# Patient Record
Sex: Female | Born: 1960
Health system: Southern US, Community
[De-identification: ages and names within clinical notes are randomized; demographics above are authoritative.]

## PROBLEM LIST (undated history)

## (undated) DIAGNOSIS — S42209A Unspecified fracture of upper end of unspecified humerus, initial encounter for closed fracture: Secondary | ICD-10-CM

## (undated) DIAGNOSIS — N6320 Unspecified lump in the left breast, unspecified quadrant: Secondary | ICD-10-CM

## (undated) DIAGNOSIS — C44721 Squamous cell carcinoma of skin of unspecified lower limb, including hip: Secondary | ICD-10-CM

## (undated) DIAGNOSIS — K219 Gastro-esophageal reflux disease without esophagitis: Secondary | ICD-10-CM

## (undated) DIAGNOSIS — N83209 Unspecified ovarian cyst, unspecified side: Secondary | ICD-10-CM

## (undated) HISTORY — PX: OVARIAN CYST REMOVAL: SHX89

## (undated) HISTORY — DX: Unspecified ovarian cyst, unspecified side: N83.209

## (undated) HISTORY — PX: HUMERUS FRACTURE SURGERY: SHX670

## (undated) HISTORY — DX: Unspecified fracture of upper end of unspecified humerus, initial encounter for closed fracture: S42.209A

---
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
## 1980-01-20 DIAGNOSIS — N83209 Unspecified ovarian cyst, unspecified side: Secondary | ICD-10-CM

## 1980-01-20 HISTORY — DX: Unspecified ovarian cyst, unspecified side: N83.209

## 1997-11-07 ENCOUNTER — Other Ambulatory Visit: Admission: RE | Admit: 1997-11-07 | Discharge: 1997-11-07 | Payer: Self-pay | Admitting: Obstetrics and Gynecology

## 1998-11-18 ENCOUNTER — Other Ambulatory Visit: Admission: RE | Admit: 1998-11-18 | Discharge: 1998-11-18 | Payer: Self-pay | Admitting: Obstetrics and Gynecology

## 2000-02-20 ENCOUNTER — Other Ambulatory Visit: Admission: RE | Admit: 2000-02-20 | Discharge: 2000-02-20 | Payer: Self-pay | Admitting: Obstetrics and Gynecology

## 2001-04-18 ENCOUNTER — Other Ambulatory Visit: Admission: RE | Admit: 2001-04-18 | Discharge: 2001-04-18 | Payer: Self-pay | Admitting: Obstetrics and Gynecology

## 2002-05-17 ENCOUNTER — Other Ambulatory Visit: Admission: RE | Admit: 2002-05-17 | Discharge: 2002-05-17 | Payer: Self-pay | Admitting: Obstetrics and Gynecology

## 2003-10-11 ENCOUNTER — Other Ambulatory Visit: Admission: RE | Admit: 2003-10-11 | Discharge: 2003-10-11 | Payer: Self-pay | Admitting: Obstetrics and Gynecology

## 2004-04-25 ENCOUNTER — Ambulatory Visit: Payer: Self-pay | Admitting: Internal Medicine

## 2004-07-28 ENCOUNTER — Encounter: Admission: RE | Admit: 2004-07-28 | Discharge: 2004-07-28 | Payer: Self-pay | Admitting: Gastroenterology

## 2004-10-08 ENCOUNTER — Ambulatory Visit: Payer: Self-pay | Admitting: Internal Medicine

## 2004-11-11 ENCOUNTER — Other Ambulatory Visit: Admission: RE | Admit: 2004-11-11 | Discharge: 2004-11-11 | Payer: Self-pay | Admitting: Obstetrics and Gynecology

## 2005-01-30 ENCOUNTER — Ambulatory Visit: Payer: Self-pay | Admitting: Internal Medicine

## 2005-05-04 ENCOUNTER — Ambulatory Visit: Payer: Self-pay | Admitting: Internal Medicine

## 2009-04-30 ENCOUNTER — Encounter: Payer: Self-pay | Admitting: Internal Medicine

## 2009-05-22 ENCOUNTER — Encounter: Payer: Self-pay | Admitting: Internal Medicine

## 2009-06-03 ENCOUNTER — Ambulatory Visit: Payer: Self-pay | Admitting: Internal Medicine

## 2009-06-03 DIAGNOSIS — E559 Vitamin D deficiency, unspecified: Secondary | ICD-10-CM | POA: Insufficient documentation

## 2009-06-03 DIAGNOSIS — E785 Hyperlipidemia, unspecified: Secondary | ICD-10-CM | POA: Insufficient documentation

## 2009-06-03 LAB — CONVERTED CEMR LAB
Cholesterol, target level: 200 mg/dL
HDL goal, serum: 40 mg/dL
LDL Goal: 160 mg/dL

## 2010-01-19 HISTORY — PX: COLONOSCOPY: SHX174

## 2010-02-09 ENCOUNTER — Encounter: Payer: Self-pay | Admitting: Obstetrics and Gynecology

## 2010-02-18 NOTE — Letter (Signed)
Summary: [facility]   Imported By: Lanelle Bal 06/06/2009 14:15:00  _____________________________________________________________________  External Attachment:    Type:   Image     Comment:   External Document

## 2010-02-18 NOTE — Assessment & Plan Note (Signed)
Summary: ELEVATED LIPIDS,[employer],SENT BY DR. MCCOMB/RH......   Vital Signs:  Patient profile:   50 year old female Weight:      130.8 pounds Pulse rate:   72 / minute Resp:     12 per minute BP sitting:   120 / 68  (left arm) Cuff size:   regular  Vitals Entered By: Shonna Chock (Jun 03, 2009 10:54 AM) CC: Discuss Labs from anothe Doctor, Lipid Management Comments REVIEWED MED LIST, PATIENT AGREED DOSE AND INSTRUCTION CORRECT    CC:  Discuss Labs from anothe Doctor and Lipid Management.  History of Present Illness: Labs from Dr Arelia Sneddon  reviewed  & risks discussed. Vitamin D was 32 on no vitamin D supplementation. LDL 117, HDL 45, TG 216; no specific diet. TG have been 333.  Lipid Management History:      Negative NCEP/ATP III risk factors include female age less than 50 years old, no history of early menopause without estrogen hormone replacement, non-diabetic, no family history for ischemic heart disease, non-tobacco-user status, non-hypertensive, no ASHD (atherosclerotic heart disease), no prior stroke/TIA, no peripheral vascular disease, and no history of aortic aneurysm.     Preventive Screening-Counseling & Management  Alcohol-Tobacco     Smoking Status: never  Caffeine-Diet-Exercise     Does Patient Exercise: yes  Allergies (verified): No Known Drug Allergies  Past History:  Past Medical History: Vitamin D deficiency Hyperlipidemia ( chiefly Hypertriglyceridemia by history ). [place] LDL goal = < 160.  Family History: Father: Osteoporosis, lipids Mother: Osteoporosis, lipids Siblings: bro lipids; PGF MI; PGM MI  Social History: Never Smoked Alcohol use-yes:minimal Regular exercise-yes: running up to 20 mpweek , Elliptical  w/o symptoms (training for 1/2 Marathon) Smoking Status:  never Does Patient Exercise:  yes  Review of Systems CV:  Denies chest pain or discomfort, leg cramps with exertion, palpitations, shortness of breath with exertion, swelling  of feet, and swelling of hands.  Physical Exam  General:  well-nourished; alert,appropriate and cooperative throughout examination Lungs:  Normal respiratory effort, chest expands symmetrically. Lungs are clear to auscultation, no crackles or wheezes. Heart:  Normal rate and regular rhythm. S1 and S2 normal without gallop, murmur, click, rub. Pulses:  R and L carotid,radial,dorsalis pedis and posterior tibial pulses are full and equal bilaterally Psych:  memory intact for recent and remote, normally interactive, and good eye contact.   Focused & intelligent   Impression & Recommendations:  Problem # 1:  HYPERLIPIDEMIA (ICD-272.4)  Problem # 2:  VITAMIN D DEFICIENCY (ICD-268.9)  Lipid Assessment/Plan:      Based on NCEP/ATP III, the patient's risk factor category is "0-1 risk factors".  The patient's lipid goals are as follows: Total cholesterol goal is 200; LDL cholesterol goal is 160; HDL cholesterol goal is 40; Triglyceride goal is 150.  Her LDL cholesterol goal has been met.    Patient Instructions: 1)  Consume LESS THAN 25 grams of sugar / day from foods & drinks with High Fructose  Corn Syrup as #1,2 or # 3 on label.Please schedule a follow-up  fastin Lab appointment in 4 months for the NMR Lipoprofile Lipid Panel ,glucose, & vit D level. Vit D3 @ least 1000 International Units once daily

## 2010-09-04 ENCOUNTER — Ambulatory Visit (INDEPENDENT_AMBULATORY_CARE_PROVIDER_SITE_OTHER): Payer: BC Managed Care – PPO | Admitting: Sports Medicine

## 2010-09-04 ENCOUNTER — Encounter: Payer: Self-pay | Admitting: Sports Medicine

## 2010-09-04 VITALS — BP 112/73 | HR 66 | Temp 97.7°F | Ht 64.0 in | Wt 130.0 lb

## 2010-09-04 DIAGNOSIS — M25561 Pain in right knee: Secondary | ICD-10-CM

## 2010-09-04 DIAGNOSIS — M25569 Pain in unspecified knee: Secondary | ICD-10-CM

## 2010-09-04 HISTORY — DX: Pain in right knee: M25.561

## 2010-09-04 MED ORDER — NITROGLYCERIN 0.2 MG/HR TD PT24: 1.0000 | MEDICATED_PATCH | Freq: Every day | TRANSDERMAL | Status: DC

## 2010-09-04 NOTE — Patient Instructions (Signed)
Very nice to meet you We will try a nitro patch.  Put 1/4 of a patch on daily Consider more non weight bearing exercises such as biking or swimming for some time but if you want to run likely won't cause much more problem but only do it every other day.  We will put a compression sleeve on as well.  Also do the exercises that we gave you.  Ball between your knees and extend your knee up to 45-60 degrees and then extension, potentially with ankle weights Straight leg raise raises On a book do calf raises/ squats on a book to help with extension We want you to come back in 6 weeks to make sure you are getting better.

## 2010-09-04 NOTE — Progress Notes (Signed)
  Subjective:    Patient ID: Jamie Avila, female    DOB: [birth year], 50 y.o.   MRN: [id number]  HPI Pt states in the first week of April started to have pain in knee after she was doing some leg extension exercises on a machine at the gym.  Pt states after that started to have severe pain, right knee on the medial aspect with squatting or even picking something up.  Pt attempted to take it easy for a couple of weeks and then started to run again in May and started to have pain again right above the knee cap on the medial side. Pt then took another two weeks off from running but then in June attempted to run again.  In patient states this time with the knee she had a little bit of swelling and noticed that she cannot sit [place] style anymore. She does feel some catching from time to time it feels like popping underneath her kneecap. Patient's main question today is when can she start running again.    Review of Systems Review of systems is otherwise negative unless noted in the history of present illness HPI Patient's past medical history past surgical history medications and allergies have all been reviewed.    Objective:   Physical Exam Gen: Knee: Normal to inspection with no erythema or effusion or obvious bony abnormalities. Palpation normal with no warmth or joint line tenderness  or condyle tenderness.  Pt does state some tenderness at the superior medial pole of the patella.  Pt though does have significant pooping of the knee when going from a flexed position to an extended position.  ROM normal in flexion and extension and lower leg rotation. Ligaments with solid consistent endpoints including ACL, PCL, LCL, MCL. Negative Mcmurray's and provocative meniscal tests. Non painful patellar compression but does have some crepitus.  Patellar and quadriceps tendons unremarkable. Hamstring and quadriceps strength is normal.  MSK ultrasound Pt MCL intact without any effusion or tear  seen. Pt posterior groove of the patella is unremarkable with good cartilage.  Pt the superior pole of medial patella has some spurring and at the insertion of the VMO does show some small calcifications and mild edema suggested by hypoechoic change There is some increase in doppler activity and neovessels        Assessment & Plan:

## 2010-09-04 NOTE — Assessment & Plan Note (Addendum)
Discussed findings with patient. The possible injury was likely due to the quadricep extensions patient did at the gym. Gave pt rehab exercises and stretches to do. Patient was prescribed on nitroglycerin patch and will be doing a quarter of a patch daily. Patient is able to do any exercise that is tolerable. Patient to return to clinic in 6 weeks  This appears to involve only about 20% of the tendon from the vastus medialis obliqus

## 2010-10-16 ENCOUNTER — Ambulatory Visit (INDEPENDENT_AMBULATORY_CARE_PROVIDER_SITE_OTHER): Payer: BC Managed Care – PPO | Admitting: Sports Medicine

## 2010-10-16 VITALS — BP 106/70

## 2010-10-16 DIAGNOSIS — S43006A Unspecified dislocation of unspecified shoulder joint, initial encounter: Secondary | ICD-10-CM

## 2010-10-16 DIAGNOSIS — M25569 Pain in unspecified knee: Secondary | ICD-10-CM

## 2010-10-16 DIAGNOSIS — M25561 Pain in right knee: Secondary | ICD-10-CM

## 2010-10-16 HISTORY — DX: Unspecified dislocation of unspecified shoulder joint, initial encounter: S43.006A

## 2010-10-16 NOTE — Assessment & Plan Note (Signed)
Much improved She can wean off the nitroglycerin patches over the next couple weeks Keep up the exercises at least 3 times per week and gradually restart her running

## 2010-10-16 NOTE — Patient Instructions (Signed)
Ok to stop nitroglycerin on knee, if knee pain returns you may put nitroglycerin patch on   Continue doing knee exercises 3-4 times per week  Ok to start running  Please follow up as needed

## 2010-10-16 NOTE — Progress Notes (Signed)
  Subjective:    Patient ID: Jamie Avila, female    DOB: 08/02/1960, 50 y.o.   MRN: [id number]  HPI 3 and 1/2 weeks ago had post dislocation of RT shoulder Had AC separation on left Fell into a river while hiking  Now getting motion back in RT shoulder Seeing Dr Orlan Leavens at [facility]  Rt knee less painful But has not been using this as much No problems with the nitroglycerin patches She has been doing her rehabilitation exercises   Review of Systems     Objective:   Physical Exam NAD  Knee: Normal to inspection with no erythema or effusion or obvious bony abnormalities. Palpation normal with no warmth or joint line tenderness or patellar tenderness or condyle tenderness. ROM normal in flexion and extension and lower leg rotation. Ligaments with solid consistent endpoints including ACL, PCL, LCL, MCL. Negative Mcmurray's and provocative meniscal tests. Non painful patellar compression. Much less clicking and popping at upper outer patella  Patellar and quadriceps tendons unremarkable. Hamstring and quadriceps strength is normal.  RT shoulder full forward flexion/ abduction to 90 deg/ ER to neutral  Slt step off left [place]  MSK ultrasound On the right knee the quadriceps tendon looks intact  There is less calcification than noted before On lateral superior border there is still a little bit of scar tissue just below the tendon Doppler activity over the tendon has now returned to normal  Right rotator cuff is scanned Supraspinatus infraspinatus and subscapularis tendons low normal Bicipital tendon has a very small area of increased fluid but is otherwise normal A.c. joint is normal  Left shoulder is scanned over the a.c. Joint This shows that the a.c. joint is subluxed but not completely separated There is increased fluid in the a.c. joint when compared to the right    Assessment & Plan:

## 2010-10-16 NOTE — Assessment & Plan Note (Signed)
Continue followup with the orthopedic surgeon Once he thinks he is stable he we'll probably send her to physical therapy  She does have a small separation of bone at the insertion of the posterior labrum of the right glenohumeral joint This seems small I don't see any tearing of the rotator cuff I reassured her that I think conservative care will be enough to heal this but she will have to wait to see how it progresses with therapy

## 2011-05-26 ENCOUNTER — Ambulatory Visit (INDEPENDENT_AMBULATORY_CARE_PROVIDER_SITE_OTHER): Payer: BC Managed Care – PPO | Admitting: Sports Medicine

## 2011-05-26 VITALS — BP 126/84

## 2011-05-26 DIAGNOSIS — M7672 Peroneal tendinitis, left leg: Secondary | ICD-10-CM | POA: Insufficient documentation

## 2011-05-26 DIAGNOSIS — M7750 Other enthesopathy of unspecified foot: Secondary | ICD-10-CM

## 2011-05-26 HISTORY — DX: Peroneal tendinitis, left leg: M76.72

## 2011-05-26 MED ORDER — KETOPROFEN POWD: Status: DC

## 2011-05-26 NOTE — Assessment & Plan Note (Signed)
Peroneal tendinitis. Plan to treat for 2 weeks with ketoprofen gel, ankle compression sleeve. Gave exercises including theraband eversion exercises and hip strengthening exercises. Plan to see back in 2 weeks. If not improved may want to image with ultrasound.

## 2011-05-26 NOTE — Progress Notes (Signed)
  Subjective:    Patient ID: Jamie Avila, female    DOB: October 30, 1960, 51 y.o.   MRN: [id number]  HPI Patient presents after running a half marathon on Saturday. She said that mouth and she noticed some left-sided foot pain that was dull. She kept running and in fact sped up. After the race she notes that she was limping quite a bit and in a lot of pain in that area. She iced it that day and took Motrin for 2 days. She felt better yesterday but was very active cleaning out her sons dorm room. This morning she has pain in that left side. She's also noticed a little bit of swelling.   Review of Systems     Objective:   Physical Exam Vital signs reviewed General appearance - alert, well appearing, and in no distress Foot exam-flat feet however no pronation. Posterior tibialis fires normally Running and walking gait are normal. Tender to palpation over the cuboid and the peroneus longus groove. Leg exam-Leg length is symmetric Normal alignment of the legs and knees Strong adduction of hips Weak abduction of hips       Assessment & Plan:

## 2011-05-28 ENCOUNTER — Ambulatory Visit: Payer: BC Managed Care – PPO | Admitting: Sports Medicine

## 2011-06-23 ENCOUNTER — Ambulatory Visit: Payer: BC Managed Care – PPO | Admitting: Sports Medicine

## 2011-09-22 ENCOUNTER — Ambulatory Visit (INDEPENDENT_AMBULATORY_CARE_PROVIDER_SITE_OTHER): Payer: BC Managed Care – PPO | Admitting: Sports Medicine

## 2011-09-22 VITALS — BP 110/70 | Ht 64.0 in | Wt 125.0 lb

## 2011-09-22 DIAGNOSIS — M533 Sacrococcygeal disorders, not elsewhere classified: Secondary | ICD-10-CM

## 2011-09-22 HISTORY — DX: Sacrococcygeal disorders, not elsewhere classified: M53.3

## 2011-09-22 NOTE — Assessment & Plan Note (Signed)
We will use HEP  Preventive activity and daily walking  Reck in 6 wks if not resolved

## 2011-09-22 NOTE — Progress Notes (Signed)
  Subjective:    Patient ID: Jamie Avila, female    DOB: 12-Jul-1960, 51 y.o.   MRN: [id number]  HPI  Patient presents with complaint of LBP x 8 weeks. States that pain initially started when she was walking up steep hills in [place].  She has no h/o back pain. Patient had pain after that first injury in [place] that worsened 3 days later to the point that she could not move. Patient notes that her pain has since improved. She went to a chiropractor in [place] and noted that she had significant improvement after her 1st two sessions. Patient notes that she continues to have discomfort with prolonged sitting. Pain is better with walking. Lifting and pulling exacerbate her pain. She has no pain while lying or sleeping, but does feel stiff in the morning.  Review of Systems     Objective:   Physical Exam  Full active ROM in back flexion, extension, lateral bending, and rotation without pain. No bony vertebral TTP No paraspinal TTP TTP over left SI joint with palpable and tender SI joint mice SLR negative Faber's negative Able to walk on tip toes, heels, lateral feet. Normal cross over gait Good SI joint mobility      Assessment & Plan:  #1. SI joint pain, likely due to healing lumbosacral fascia injury as evidence by tender SI joint and palpable SI joint mice  - Reassured patient of etiology and progress toward recovery  - May return to running but avoid running downhill as this will cause back extension  - Home exercises: Legs to chest, leg to opposite shoulder, leg to chest with side to side roll  - Avoid prolonged sitting

## 2012-03-14 ENCOUNTER — Ambulatory Visit: Payer: BC Managed Care – PPO | Admitting: Internal Medicine

## 2012-03-14 DIAGNOSIS — Z0289 Encounter for other administrative examinations: Secondary | ICD-10-CM

## 2012-03-21 ENCOUNTER — Encounter: Payer: Self-pay | Admitting: Internal Medicine

## 2012-10-20 ENCOUNTER — Ambulatory Visit: Payer: BC Managed Care – PPO | Admitting: Sports Medicine

## 2014-01-19 DIAGNOSIS — S42209A Unspecified fracture of upper end of unspecified humerus, initial encounter for closed fracture: Secondary | ICD-10-CM

## 2014-01-19 HISTORY — DX: Unspecified fracture of upper end of unspecified humerus, initial encounter for closed fracture: S42.209A

## 2014-01-19 HISTORY — PX: HUMERUS FRACTURE SURGERY: SHX670

## 2016-01-30 LAB — LIPID PANEL
CHOLESTEROL: 197 (ref 0–200)
HDL: 46 (ref 35–70)
LDL CALC: 83
TRIGLYCERIDES: 339 — AB (ref 40–160)

## 2016-01-30 LAB — BASIC METABOLIC PANEL: GLUCOSE: 80

## 2016-01-30 LAB — HM DEXA SCAN

## 2017-02-01 DIAGNOSIS — Z1231 Encounter for screening mammogram for malignant neoplasm of breast: Secondary | ICD-10-CM | POA: Diagnosis not present

## 2017-02-01 DIAGNOSIS — Z6824 Body mass index (BMI) 24.0-24.9, adult: Secondary | ICD-10-CM | POA: Diagnosis not present

## 2017-02-01 DIAGNOSIS — Z01419 Encounter for gynecological examination (general) (routine) without abnormal findings: Secondary | ICD-10-CM | POA: Diagnosis not present

## 2017-02-04 ENCOUNTER — Other Ambulatory Visit: Payer: Self-pay | Admitting: Obstetrics and Gynecology

## 2017-02-04 DIAGNOSIS — R928 Other abnormal and inconclusive findings on diagnostic imaging of breast: Secondary | ICD-10-CM

## 2017-02-09 ENCOUNTER — Ambulatory Visit
Admission: RE | Admit: 2017-02-09 | Discharge: 2017-02-09 | Disposition: A | Discharge status: Home / Self Care | Payer: BLUE CROSS/BLUE SHIELD | Source: Ambulatory Visit | Attending: Obstetrics and Gynecology | Admitting: Obstetrics and Gynecology

## 2017-02-09 DIAGNOSIS — R928 Other abnormal and inconclusive findings on diagnostic imaging of breast: Secondary | ICD-10-CM

## 2017-02-09 DIAGNOSIS — N6001 Solitary cyst of right breast: Secondary | ICD-10-CM | POA: Diagnosis not present

## 2017-10-28 ENCOUNTER — Ambulatory Visit: Payer: BLUE CROSS/BLUE SHIELD | Admitting: Family Medicine

## 2017-11-08 ENCOUNTER — Ambulatory Visit: Payer: BLUE CROSS/BLUE SHIELD | Admitting: Family Medicine

## 2017-11-08 ENCOUNTER — Encounter: Payer: Self-pay | Admitting: Family Medicine

## 2017-11-08 VITALS — BP 122/60 | HR 69 | Temp 98.0°F | Ht 64.0 in | Wt 143.2 lb

## 2017-11-08 DIAGNOSIS — E559 Vitamin D deficiency, unspecified: Secondary | ICD-10-CM

## 2017-11-08 DIAGNOSIS — Z23 Encounter for immunization: Secondary | ICD-10-CM | POA: Diagnosis not present

## 2017-11-08 DIAGNOSIS — D229 Melanocytic nevi, unspecified: Secondary | ICD-10-CM

## 2017-11-08 DIAGNOSIS — R5383 Other fatigue: Secondary | ICD-10-CM

## 2017-11-08 DIAGNOSIS — E785 Hyperlipidemia, unspecified: Secondary | ICD-10-CM | POA: Diagnosis not present

## 2017-11-08 DIAGNOSIS — M810 Age-related osteoporosis without current pathological fracture: Secondary | ICD-10-CM

## 2017-11-08 NOTE — Patient Instructions (Addendum)
You can either schedule your bloodwork in near future or wait 3 months to complete.   I have added some dietary information regarding low cholesterol diets that you can review. I would recommend fasting for your bloodwork.   I will call you once I get your results and when I get your previous records. Keep up your healthy lifestyle in the meanwhile!   Cholesterol Cholesterol is a white, waxy, fat-like substance that is needed by the human body in small amounts. The liver makes all the cholesterol we need. Cholesterol is carried from the liver by the blood through the blood vessels. Deposits of cholesterol (plaques) may build up on blood vessel (artery) walls. Plaques make the arteries narrower and stiffer. Cholesterol plaques increase the risk for heart attack and stroke. You cannot feel your cholesterol level even if it is very high. The only way to know that it is high is to have a blood test. Once you know your cholesterol levels, you should keep a record of the test results. Work with your health care provider to keep your levels in the desired range. What do the results mean?  Total cholesterol is a rough measure of all the cholesterol in your blood.  LDL (low-density lipoprotein) is the "bad" cholesterol. This is the type that causes plaque to build up on the artery walls. You want this level to be low.  HDL (high-density lipoprotein) is the "good" cholesterol because it cleans the arteries and carries the LDL away. You want this level to be high.  Triglycerides are fat that the body can either burn for energy or store. High levels are closely linked to heart disease. What are the desired levels of cholesterol?  Total cholesterol below 200.  LDL below 100 for people who are at risk, below 70 for people at very high risk.  HDL above 40 is good. A level of 60 or higher is considered to be protective against heart disease.  Triglycerides below 150. How can I lower my  cholesterol? Diet Follow your diet program as told by your health care provider.  Choose fish or white meat chicken and [place], roasted or baked. Limit fatty cuts of red meat, fried foods, and processed meats, such as sausage and lunch meats.  Eat lots of fresh fruits and vegetables.  Choose whole grains, beans, pasta, potatoes, and cereals.  Choose olive oil, corn oil, or canola oil, and use only small amounts.  Avoid butter, mayonnaise, shortening, or palm kernel oils.  Avoid foods with trans fats.  Drink skim or nonfat milk and eat low-fat or nonfat yogurt and cheeses. Avoid whole milk, cream, ice cream, egg yolks, and full-fat cheeses.  Healthier desserts include angel food cake, ginger snaps, animal crackers, hard candy, popsicles, and low-fat or nonfat frozen yogurt. Avoid pastries, cakes, pies, and cookies.  Exercise  Follow your exercise program as told by your health care provider. A regular program: ? Helps to decrease LDL and raise HDL. ? Helps with weight control.  Do things that increase your activity level, such as gardening, walking, and taking the stairs.  Ask your health care provider about ways that you can be more active in your daily life.  Medicine  Take over-the-counter and prescription medicines only as told by your health care provider. ? Medicine may be prescribed by your health care provider to help lower cholesterol and decrease the risk for heart disease. This is usually done if diet and exercise have failed to bring down cholesterol levels. ?  If you have several risk factors, you may need medicine even if your levels are normal.  This information is not intended to replace advice given to you by your health care provider. Make sure you discuss any questions you have with your health care provider. Document Released: 09/30/2000 Document Revised: 08/03/2015 Document Reviewed: 07/06/2015 Elsevier Interactive Patient Education  2018 [place] refers to food and lifestyle choices that are based on the traditions of countries located on the [employer]. This way of eating has been shown to help prevent certain conditions and improve outcomes for people who have chronic diseases, like kidney disease and heart disease. What are tips for following this plan? Lifestyle  Cook and eat meals together with your family, when possible.  Drink enough fluid to keep your urine clear or pale yellow.  Be physically active every day. This includes: ? Aerobic exercise like running or swimming. ? Leisure activities like gardening, walking, or housework.  Get 7-8 hours of sleep each night.  If recommended by your health care provider, drink red wine in moderation. This means 1 glass a day for nonpregnant women and 2 glasses a day for men. A glass of wine equals 5 oz (150 mL). Reading food labels  Check the serving size of packaged foods. For foods such as rice and pasta, the serving size refers to the amount of cooked product, not dry.  Check the total fat in packaged foods. Avoid foods that have saturated fat or trans fats.  Check the ingredients list for added sugars, such as corn syrup. Shopping  At the grocery store, buy most of your food from the areas near the walls of the store. This includes: ? Fresh fruits and vegetables (produce). ? Grains, beans, nuts, and seeds. Some of these may be available in unpackaged forms or large amounts (in bulk). ? Fresh seafood. ? Poultry and eggs. ? Low-fat dairy products.  Buy whole ingredients instead of prepackaged foods.  Buy fresh fruits and vegetables in-season from local farmers markets.  Buy frozen fruits and vegetables in resealable bags.  If you do not have access to quality fresh seafood, buy precooked frozen shrimp or canned fish, such as tuna, salmon, or sardines.  Buy small amounts of raw or cooked vegetables, salads, or olives  from the deli or salad bar at your store.  Stock your pantry so you always have certain foods on hand, such as olive oil, canned tuna, canned tomatoes, rice, pasta, and beans. Cooking  Cook foods with extra-virgin olive oil instead of using butter or other vegetable oils.  Have meat as a side dish, and have vegetables or grains as your main dish. This means having meat in small portions or adding small amounts of meat to foods like pasta or stew.  Use beans or vegetables instead of meat in common dishes like chili or lasagna.  Experiment with different cooking methods. Try roasting or broiling vegetables instead of steaming or sauteing them.  Add frozen vegetables to soups, stews, pasta, or rice.  Add nuts or seeds for added healthy fat at each meal. You can add these to yogurt, salads, or vegetable dishes.  Marinate fish or vegetables using olive oil, lemon juice, garlic, and fresh herbs. Meal planning  Plan to eat 1 vegetarian meal one day each week. Try to work up to 2 vegetarian meals, if possible.  Eat seafood 2 or more times a week.  Have healthy snacks readily available, such  as: ? Vegetable sticks with hummus. ? [place] yogurt. ? Fruit and nut trail mix.  Eat balanced meals throughout the week. This includes: ? Fruit: 2-3 servings a day ? Vegetables: 4-5 servings a day ? Low-fat dairy: 2 servings a day ? Fish, poultry, or lean meat: 1 serving a day ? Beans and legumes: 2 or more servings a week ? Nuts and seeds: 1-2 servings a day ? Whole grains: 6-8 servings a day ? Extra-virgin olive oil: 3-4 servings a day  Limit red meat and sweets to only a few servings a month What are my food choices?  Mediterranean diet ? Recommended ? Grains: Whole-grain pasta. Brown rice. [place] wheat. Polenta. Couscous. Whole-wheat bread. [place]. ? Vegetables: Artichokes. Beets. Broccoli. Cabbage. Carrots. Eggplant. Green beans. Chard. Kale. Spinach. Onions. Leeks. Peas. Squash.  Tomatoes. Peppers. Radishes. ? Fruits: Apples. Apricots. Avocado. Berries. Bananas. Cherries. Dates. Figs. Grapes. Lemons. Melon. Oranges. Peaches. Plums. Pomegranate. ? Meats and other protein foods: Beans. Almonds. Sunflower seeds. Pine nuts. Peanuts. [place]. Salmon. Scallops. Shrimp. [place]. Tilapia. Clams. Oysters. Eggs. ? Dairy: Low-fat milk. Cheese. Greek yogurt. ? Beverages: Water. Red wine. Herbal tea. ? Fats and oils: Extra virgin olive oil. Avocado oil. Grape seed oil. ? Sweets and desserts: [place] yogurt with honey. Baked apples. Poached pears. Trail mix. ? Seasoning and other foods: Basil. Cilantro. Coriander. Cumin. Mint. Parsley. Sage. Rosemary. Tarragon. Garlic. Oregano. Thyme. Pepper. Balsalmic vinegar. Tahini. Hummus. Tomato sauce. Olives. Mushrooms. ? Limit these ? Grains: Prepackaged pasta or rice dishes. Prepackaged cereal with added sugar. ? Vegetables: Deep fried potatoes (french fries). ? Fruits: Fruit canned in syrup. ? Meats and other protein foods: Beef. Pork. Lamb. Poultry with skin. Hot dogs. [place] Salines. ? Dairy: Ice cream. Sour cream. Whole milk. ? Beverages: Juice. Sugar-sweetened soft drinks. Beer. Liquor and spirits. ? Fats and oils: Butter. Canola oil. Vegetable oil. Beef fat (tallow). Lard. ? Sweets and desserts: Cookies. Cakes. Pies. Candy. ? Seasoning and other foods: Mayonnaise. Premade sauces and marinades. ? The items listed may not be a complete list. Talk with your dietitian about what dietary choices are right for you. Summary  The Mediterranean diet includes both food and lifestyle choices.  Eat a variety of fresh fruits and vegetables, beans, nuts, seeds, and whole grains.  Limit the amount of red meat and sweets that you eat.  Talk with your health care provider about whether it is safe for you to drink red wine in moderation. This means 1 glass a day for nonpregnant women and 2 glasses a day for men. A glass of wine equals 5 oz (150 mL). This information  is not intended to replace advice given to you by your health care provider. Make sure you discuss any questions you have with your health care provider. Document Released: 08/29/2015 Document Revised: 10/01/2015 Document Reviewed: 08/29/2015 Elsevier Interactive Patient Education  [employer].

## 2017-11-08 NOTE — Progress Notes (Signed)
[place] DOB: May 20, 1960 Encounter date: 11/08/2017  This is a 57 y.o. female who presents to establish care. Chief Complaint  Patient presents with  . New Patient (Initial Visit)    no new concerns    History of present illness: Recommended follow up from obgyn for elevated TG as osteoporosis. Total cholesterol 197 TG 339; HDL 46; LDL 83 (01/2016). Have been high in past, but have fluctuated. Never treated for cholesterol. 10 years ago was sent to doc and advised to watch diet. Would prefer to avoid medication if possible. Would be ok with some supplements.   She had discussed fosamax with obgyn but hadn't started. They discussed Vitamin D and exercise. Last bone density was in 2018 (I do not have results today). Has had low bone density since 40's. Even at that time had osteopenia. Had tried fosamax in past (acid reflux issues with it 15 yrs ago) were significant. Some fluctuation. On the fence about treatment. Has exercised quite a bit since last bone density (and doing weight bearing type of exercise). Would like to repeat bone density to see how numbers are looking with this change. Not doing vitamin D or Calcium supplementation.   Obgyn not huge fan of hormone replacement. She is trying to wean off, but symptoms seem to be returning. Hasn't taken it for 2 months at this point. Not great about taking daily medication so didn't want to do anti-depressant. Tried patch - got period. Sleep is biggest issue for her. Hot flashes during the day are manageable, but feels like it keeps getting worse through day/night; so by evening bothering her nearly every hour.   Past Medical History:  Diagnosis Date  . Ovarian cyst 1982  . Proximal humerus fracture 2016   Past Surgical History:  Procedure Laterality Date  . COLONOSCOPY  2012   repeat 2022  . HUMERUS FRACTURE SURGERY  2016   Allergies  Allergen Reactions  . Penicillins Rash   No outpatient medications have been marked as  taking for the 11/08/17 encounter (Office Visit) with Caren Macadam, MD.   Social History   Tobacco Use  . Smoking status: Never Smoker  . Smokeless tobacco: Never Used  Substance Use Topics  . Alcohol use: Not on file   Family History  Problem Relation Age of Onset  . High Cholesterol Mother   . Osteoporosis Mother   . COPD Mother   . Arthritis Mother        rheumatoid in hands; mild  . Hearing loss Mother   . High Cholesterol Father   . Arthritis Father        osteo  . Osteoporosis Father   . CAD Father   . Heart disease Maternal Grandmother        CHF; worsened after hip fracture  . Early death Maternal Grandfather        uncertain cause  . Heart attack Paternal Grandmother 78  . Heart disease Paternal Grandmother   . Early death Paternal Grandfather 94       ?valve issue  . Heart attack Paternal Grandfather   . Heart disease Paternal Grandfather      Review of Systems  Constitutional: Negative for chills, fatigue and fever.  Respiratory: Negative for cough, chest tightness, shortness of breath and wheezing.   Cardiovascular: Negative for chest pain, palpitations and leg swelling.  Psychiatric/Behavioral: Positive for sleep disturbance (due to hot flashes; generally feels rested in morning).    Objective:  BP 122/60 (BP  Location: Left Arm, Patient Position: Sitting, Cuff Size: Normal)   Pulse 69   Temp 98 F (36.7 C) (Oral)   Ht 5\' 4"  (1.626 m)   Wt 143 lb 3.2 oz (65 kg)   SpO2 (!) 87%   BMI 24.58 kg/m   Weight: 143 lb 3.2 oz (65 kg)   BP Readings from Last 3 Encounters:  11/08/17 122/60  09/22/11 110/70  05/26/11 126/84   Wt Readings from Last 3 Encounters:  11/08/17 143 lb 3.2 oz (65 kg)  09/22/11 125 lb (56.7 kg)  09/04/10 130 lb (59 kg)    Physical Exam  Constitutional: She is oriented to person, place, and time. She appears well-developed and well-nourished. No distress.  Cardiovascular: Normal rate, regular rhythm and normal heart  sounds. Exam reveals no friction rub.  No murmur heard. No lower extremity edema  Pulmonary/Chest: Effort normal and breath sounds normal. No respiratory distress. She has no wheezes. She has no rales.  Neurological: She is alert and oriented to person, place, and time.  Psychiatric: She has a normal mood and affect. Her behavior is normal. Cognition and memory are normal.    Assessment/Plan: 1. Hyperlipidemia, unspecified hyperlipidemia type Will start with rechecking bloodwork. Follow up pending this. She prefers not to take medication if possible. - Comprehensive metabolic panel; Future - Lipid panel; Future - TSH; Future  2. Numerous skin moles  - Ambulatory referral to Dermatology  3. Osteoporosis, unspecified osteoporosis type, unspecified pathological fracture presence Will plan to repeat dexa 2-3 years after last one. Records requested. - VITAMIN D 25 Hydroxy (Vit-D Deficiency, Fractures); Future  4. Other fatigue  - TSH; Future - CBC with Differential/Platelet; Future  5. Vitamin D deficiency  - VITAMIN D 25 Hydroxy (Vit-D Deficiency, Fractures); Future  6. Need for shingles vaccine  - Varicella-zoster vaccine IM (Shingrix)  7. Need for Tdap vaccination  - Tdap vaccine greater than or equal to 7yo IM  Return pending bloodwork results.  Micheline Rough, MD

## 2017-11-11 ENCOUNTER — Telehealth: Payer: Self-pay | Admitting: *Deleted

## 2017-11-11 NOTE — Telephone Encounter (Signed)
Per Dr Ethlyn Gallery, I called the pt and informed her she has tried looking up info for the referral to dermatology with Dr Nathaneil Canary and cannot locate this provider. Patient stated this doctor has retired and she will do some research and call back with another doctor's info for the referral.  Message sent to Dr Koberlein's asst.

## 2017-11-11 NOTE — Telephone Encounter (Signed)
Noted. Will look for call abck

## 2017-11-22 ENCOUNTER — Encounter: Payer: Self-pay | Admitting: Family Medicine

## 2017-11-22 ENCOUNTER — Telehealth: Payer: Self-pay | Admitting: Family Medicine

## 2017-11-22 DIAGNOSIS — M810 Age-related osteoporosis without current pathological fracture: Secondary | ICD-10-CM

## 2017-11-22 NOTE — Telephone Encounter (Signed)
I did receive bone density from 01/2016 which showed osteoporosis at spine; osteopenia femoral necks. It had documentation that bone density was slightly lower than previous; although I did not get previous for comparison.   We had long discussion in office regarding treatment options. I have added some bloodwork to my previous order (to include vitamin D, calcium) for her to complete in January.   I still think it is ok for [place] to plan to repeat the bone density in another year and compare to most recent. Looking at these results though; it would be advised to do treatment with bisphosphonate (first line; like fosamax). However, I do think it is ok to plan to keep up with exercise, vitamin D and Calcium supplementation and recheck in a year. If not making progress at that time we will re-discuss treatment options.

## 2017-11-22 NOTE — Telephone Encounter (Signed)
Left message for patient to call back. CRM created 

## 2017-11-22 NOTE — Telephone Encounter (Signed)
Pt called back.  She can be reached at [phone].

## 2017-11-22 NOTE — Telephone Encounter (Signed)
Patient is aware 

## 2017-11-23 NOTE — Telephone Encounter (Signed)
Noted  

## 2017-12-01 ENCOUNTER — Ambulatory Visit: Payer: BLUE CROSS/BLUE SHIELD | Admitting: Family Medicine

## 2017-12-01 ENCOUNTER — Encounter: Payer: Self-pay | Admitting: Family Medicine

## 2017-12-01 VITALS — BP 110/80 | HR 68 | Temp 97.9°F | Wt 148.8 lb

## 2017-12-01 DIAGNOSIS — H6981 Other specified disorders of Eustachian tube, right ear: Secondary | ICD-10-CM | POA: Diagnosis not present

## 2017-12-01 NOTE — Progress Notes (Signed)
  Jamie Avila [place] DOB: 1960/06/07 Encounter date: 12/01/2017  This is a 57 y.o. female who presents with Chief Complaint  Patient presents with  . Ear Fullness    right    History of present illness:  Has been going on for 2 years; but usually only bothers her if someone is yelling in ear when in loud environment.   No pain. Just if loud situation; gets some reverberation on right side. Never hurts. No drainage from ear. NO congestion, sinus pressure/pain. No ear popping or jaw popping. Can feel some fullness, but that is just in last few weeks.   Doesn't bother her with travel - airplane, mountains, etc.    When she got shingles vaccine got redness in 2-3 days but gone in 5 days. Hurt some but not overly tender. Somewhat warm. No itching. Not had reactions to other vaccines in past that she knows of but it has been awhile.      Allergies  Allergen Reactions  . Penicillins Rash   No outpatient medications have been marked as taking for the 12/01/17 encounter (Office Visit) with Caren Macadam, MD.    Review of Systems  Constitutional: Negative for chills and fever.  HENT: Negative for congestion, ear discharge, ear pain, hearing loss, postnasal drip, sinus pressure and sinus pain.   Respiratory: Negative for cough.     Objective:  BP 110/80 (BP Location: Right Arm, Patient Position: Sitting, Cuff Size: Normal)   Pulse 68   Temp 97.9 F (36.6 C) (Oral)   Wt 148 lb 12.8 oz (67.5 kg)   SpO2 98%   BMI 25.54 kg/m   Weight: 148 lb 12.8 oz (67.5 kg)   BP Readings from Last 3 Encounters:  12/01/17 110/80  11/08/17 122/60  09/22/11 110/70   Wt Readings from Last 3 Encounters:  12/01/17 148 lb 12.8 oz (67.5 kg)  11/08/17 143 lb 3.2 oz (65 kg)  09/22/11 125 lb (56.7 kg)    Physical Exam  Constitutional: Vital signs are normal. She appears well-developed and well-nourished.  HENT:  Head: Normocephalic and atraumatic.  Right Ear: Hearing, tympanic membrane,  external ear and ear canal normal.  Left Ear: Hearing, tympanic membrane, external ear and ear canal normal.  Nose: Nose normal. Mucosal edema: mild edema.  Mouth/Throat: Uvula is midline and oropharynx is clear and moist. No oropharyngeal exudate, posterior oropharyngeal edema or posterior oropharyngeal erythema.    Assessment/Plan 1. Eustachian tube dysfunction, right Trial antihistamine and/or flonase. If worsening see ENT. Recommended trial for at least 2 months.   After discussion of dx; she states that she can get isolated vertigo in winter months as well. Wonders if link with eustachian tube issue.    Return if symptoms worsen or fail to improve.    Micheline Rough, MD

## 2017-12-01 NOTE — Patient Instructions (Addendum)
Could try flonase otc if ear pressure recurs. Also ok to take claritin (or anti-histamine) daily.  Eustachian Tube Dysfunction The eustachian tube connects the middle ear to the back of the nose. It regulates air pressure in the middle ear by allowing air to move between the ear and nose. It also helps to drain fluid from the middle ear space. When the eustachian tube does not function properly, air pressure, fluid, or both can build up in the middle ear. Eustachian tube dysfunction can affect one or both ears. What are the causes? This condition happens when the eustachian tube becomes blocked or cannot open normally. This may result from:  Ear infections.  Colds and other upper respiratory infections.  Allergies.  Irritation, such as from cigarette smoke or acid from the stomach coming up into the esophagus (gastroesophageal reflux).  Sudden changes in air pressure, such as from descending in an airplane.  Abnormal growths in the nose or throat, such as nasal polyps, tumors, or enlarged tissue at the back of the throat (adenoids).  What increases the risk? This condition may be more likely to develop in people who smoke and people who are overweight. Eustachian tube dysfunction may also be more likely to develop in children, especially children who have:  Certain birth defects of the mouth, such as cleft palate.  Large tonsils and adenoids.  What are the signs or symptoms? Symptoms of this condition may include:  A feeling of fullness in the ear.  Ear pain.  Clicking or popping noises in the ear.  Ringing in the ear.  Hearing loss.  Loss of balance.  Symptoms may get worse when the air pressure around you changes, such as when you travel to an area of high elevation or fly on an airplane. How is this diagnosed? This condition may be diagnosed based on:  Your symptoms.  A physical exam of your ear, nose, and throat.  Tests, such as those that measure: ? The movement  of your eardrum (tympanogram). ? Your hearing (audiometry).  How is this treated? Treatment depends on the cause and severity of your condition. If your symptoms are mild, you may be able to relieve your symptoms by moving air into ("popping") your ears. If you have symptoms of fluid in your ears, treatment may include:  Decongestants.  Antihistamines.  Nasal sprays or ear drops that contain medicines that reduce swelling (steroids).  In some cases, you may need to have a procedure to drain the fluid in your eardrum (myringotomy). In this procedure, a small tube is placed in the eardrum to:  Drain the fluid.  Restore the air in the middle ear space.  Follow these instructions at home:  Take over-the-counter and prescription medicines only as told by your health care provider.  Use techniques to help pop your ears as recommended by your health care provider. These may include: ? Chewing gum. ? Yawning. ? Frequent, forceful swallowing. ? Closing your mouth, holding your nose closed, and gently blowing as if you are trying to blow air out of your nose.  Do not do any of the following until your health care provider approves: ? Travel to high altitudes. ? Fly in airplanes. ? Work in a Pension scheme manager or room. ? Scuba dive.  Keep your ears dry. Dry your ears completely after showering or bathing.  Do not smoke.  Keep all follow-up visits as told by your health care provider. This is important. Contact a health care provider if:  Your  symptoms do not go away after treatment.  Your symptoms come back after treatment.  You are unable to pop your ears.  You have: ? A fever. ? Pain in your ear. ? Pain in your head or neck. ? Fluid draining from your ear.  Your hearing suddenly changes.  You become very dizzy.  You lose your balance. This information is not intended to replace advice given to you by your health care provider. Make sure you discuss any questions you  have with your health care provider. Document Released: 02/01/2015 Document Revised: 06/13/2015 Document Reviewed: 01/24/2014 Elsevier Interactive Patient Education  [employer].

## 2017-12-01 NOTE — Telephone Encounter (Signed)
Copied from [place] [id number]. Topic: Referral - Request for Referral >> Dec 01, 2017 10:44 AM Judyann Munson wrote: Has patient seen PCP for this complaint?  Yes  Referral for which specialty: Jari Pigg  Preferred provider/office:  [facility]at[facility] [place], [place], [place] Reason for referral:Numerous skin moles    Patient had a referral placed on 11-08-17 she stated she gave the incorrect doctors name.  Please advise

## 2018-02-08 ENCOUNTER — Other Ambulatory Visit (INDEPENDENT_AMBULATORY_CARE_PROVIDER_SITE_OTHER): Payer: BLUE CROSS/BLUE SHIELD

## 2018-02-08 ENCOUNTER — Ambulatory Visit (INDEPENDENT_AMBULATORY_CARE_PROVIDER_SITE_OTHER): Payer: BLUE CROSS/BLUE SHIELD

## 2018-02-08 DIAGNOSIS — Z23 Encounter for immunization: Secondary | ICD-10-CM

## 2018-02-08 DIAGNOSIS — R5383 Other fatigue: Secondary | ICD-10-CM | POA: Diagnosis not present

## 2018-02-08 DIAGNOSIS — E559 Vitamin D deficiency, unspecified: Secondary | ICD-10-CM

## 2018-02-08 DIAGNOSIS — M810 Age-related osteoporosis without current pathological fracture: Secondary | ICD-10-CM

## 2018-02-08 DIAGNOSIS — E785 Hyperlipidemia, unspecified: Secondary | ICD-10-CM | POA: Diagnosis not present

## 2018-02-08 LAB — CBC WITH DIFFERENTIAL/PLATELET
BASOS ABS: 0 10*3/uL (ref 0.0–0.1)
BASOS PCT: 1 % (ref 0.0–3.0)
EOS ABS: 0.1 10*3/uL (ref 0.0–0.7)
Eosinophils Relative: 2.6 % (ref 0.0–5.0)
HEMATOCRIT: 43 % (ref 36.0–46.0)
HEMOGLOBIN: 14.6 g/dL (ref 12.0–15.0)
LYMPHS PCT: 39.2 % (ref 12.0–46.0)
Lymphs Abs: 1.8 10*3/uL (ref 0.7–4.0)
MCHC: 34 g/dL (ref 30.0–36.0)
MCV: 91.9 fl (ref 78.0–100.0)
MONO ABS: 0.4 10*3/uL (ref 0.1–1.0)
Monocytes Relative: 7.6 % (ref 3.0–12.0)
Neutro Abs: 2.3 10*3/uL (ref 1.4–7.7)
Neutrophils Relative %: 49.6 % (ref 43.0–77.0)
PLATELETS: 242 10*3/uL (ref 150.0–400.0)
RBC: 4.68 Mil/uL (ref 3.87–5.11)
RDW: 12.9 % (ref 11.5–15.5)
WBC: 4.7 10*3/uL (ref 4.0–10.5)

## 2018-02-08 LAB — COMPREHENSIVE METABOLIC PANEL
ALT: 13 U/L (ref 0–35)
AST: 14 U/L (ref 0–37)
Albumin: 4.3 g/dL (ref 3.5–5.2)
Alkaline Phosphatase: 109 U/L (ref 39–117)
BUN: 19 mg/dL (ref 6–23)
CHLORIDE: 104 meq/L (ref 96–112)
CO2: 25 meq/L (ref 19–32)
CREATININE: 0.83 mg/dL (ref 0.40–1.20)
Calcium: 9.5 mg/dL (ref 8.4–10.5)
GFR: 70.64 mL/min (ref >60.00)
GLUCOSE: 87 mg/dL (ref 70–99)
POTASSIUM: 4.5 meq/L (ref 3.5–5.1)
SODIUM: 139 meq/L (ref 135–145)
Total Bilirubin: 0.5 mg/dL (ref 0.2–1.2)
Total Protein: 7.1 g/dL (ref 6.0–8.3)

## 2018-02-08 LAB — LIPID PANEL
Cholesterol: 221 mg/dL — ABNORMAL HIGH (ref 0–200)
HDL: 45.3 mg/dL (ref >39.00)
NONHDL: 175.24
Total CHOL/HDL Ratio: 5
Triglycerides: 379 mg/dL — ABNORMAL HIGH (ref 0.0–149.0)
VLDL: 75.8 mg/dL — AB (ref 0.0–40.0)

## 2018-02-08 LAB — LDL CHOLESTEROL, DIRECT: Direct LDL: 99 mg/dL

## 2018-02-08 LAB — VITAMIN D 25 HYDROXY (VIT D DEFICIENCY, FRACTURES): VITD: 27.83 ng/mL — AB (ref 30.00–100.00)

## 2018-02-08 LAB — TSH: TSH: 3.09 u[IU]/mL (ref 0.35–4.50)

## 2018-02-09 ENCOUNTER — Telehealth: Payer: Self-pay | Admitting: Family Medicine

## 2018-02-09 NOTE — Telephone Encounter (Signed)
Pt given lab results per notes of Dr Ethlyn Gallery on 02/09/2018. Pt verbalized understanding.

## 2018-03-01 DIAGNOSIS — N6310 Unspecified lump in the right breast, unspecified quadrant: Secondary | ICD-10-CM | POA: Diagnosis not present

## 2018-03-02 ENCOUNTER — Other Ambulatory Visit: Payer: Self-pay | Admitting: Obstetrics and Gynecology

## 2018-03-02 DIAGNOSIS — N6310 Unspecified lump in the right breast, unspecified quadrant: Secondary | ICD-10-CM

## 2018-03-08 ENCOUNTER — Ambulatory Visit
Admission: RE | Admit: 2018-03-08 | Discharge: 2018-03-08 | Disposition: A | Discharge status: Home / Self Care | Payer: BLUE CROSS/BLUE SHIELD | Source: Ambulatory Visit | Attending: Obstetrics and Gynecology | Admitting: Obstetrics and Gynecology

## 2018-03-08 ENCOUNTER — Other Ambulatory Visit: Payer: Self-pay | Admitting: Obstetrics and Gynecology

## 2018-03-08 DIAGNOSIS — N6320 Unspecified lump in the left breast, unspecified quadrant: Secondary | ICD-10-CM

## 2018-03-08 DIAGNOSIS — N6310 Unspecified lump in the right breast, unspecified quadrant: Secondary | ICD-10-CM

## 2018-03-08 DIAGNOSIS — N6002 Solitary cyst of left breast: Secondary | ICD-10-CM | POA: Diagnosis not present

## 2018-03-08 DIAGNOSIS — R922 Inconclusive mammogram: Secondary | ICD-10-CM | POA: Diagnosis not present

## 2018-03-10 ENCOUNTER — Ambulatory Visit
Admission: RE | Admit: 2018-03-10 | Discharge: 2018-03-10 | Disposition: A | Discharge status: Home / Self Care | Payer: BLUE CROSS/BLUE SHIELD | Source: Ambulatory Visit | Attending: Obstetrics and Gynecology | Admitting: Obstetrics and Gynecology

## 2018-03-10 DIAGNOSIS — D242 Benign neoplasm of left breast: Secondary | ICD-10-CM | POA: Diagnosis not present

## 2018-03-10 DIAGNOSIS — N6323 Unspecified lump in the left breast, lower outer quadrant: Secondary | ICD-10-CM | POA: Diagnosis not present

## 2018-03-10 DIAGNOSIS — N6310 Unspecified lump in the right breast, unspecified quadrant: Secondary | ICD-10-CM

## 2018-03-17 ENCOUNTER — Ambulatory Visit: Payer: Self-pay | Admitting: Surgery

## 2018-03-17 DIAGNOSIS — D242 Benign neoplasm of left breast: Secondary | ICD-10-CM | POA: Diagnosis not present

## 2018-03-17 NOTE — H&P (Signed)
History of Present Illness Jamie Avila. Jamie Sibley MD; 03/17/2018 5:28 PM) The patient is a 58 year old female who presents with a breast mass. Referred by Dr. Radene Avila for left intraductal papilloma  This is a 58 year old female who recently mentioned to her GYN about some mild thickening in the lower right breast. She underwent diagnostic mammograms. The right breast was normal. However the left lateral breast shows a 1.4 cm oval circumscribed mass at 3:30. This was confirmed with ultrasound. There is an adjacent cystic mass. She underwent core biopsy and the diagnosis returned intraductal papilloma. She is referred now for surgical evaluation.  Menarche age 37 First pregnancy age 59 Breast-feed yes Hormones oral contraceptives 20 years hormone supplements 3 years Menopause age 88 Family history is negative for breast cancer  CLINICAL DATA: Patient presents for palpable lumpiness within the inferior right breast.  EXAM: DIGITAL DIAGNOSTIC BILATERAL MAMMOGRAM WITH CAD AND TOMO  ULTRASOUND BILATERAL BREAST  COMPARISON: Previous exam(s).  ACR Breast Density Category c: The breast tissue is heterogeneously dense, which may obscure small masses.  FINDINGS: There is a new oval circumscribed mass within the lateral aspect of the left breast. No additional concerning masses, calcifications or distortion identified within either breast.  Mammographic images were processed with CAD.  On physical exam, I palpate dense tissue within the inferior right breast.  Targeted ultrasound is performed, showing a 1.4 x 0.3 x 0.5 cm solid and cystic mass left breast 3:30 o'clock 3 cm from nipple. This is felt to correspond with mammographic abnormality. No left axillary adenopathy.  No suspicious abnormality within the right breast inferior mammary fold at the site of palpable concern.  IMPRESSION: Indeterminate solid and cystic left breast mass 3:30 o'clock.  No suspicious abnormality at  the site of palpable concern right breast.  RECOMMENDATION: Left breast ultrasound-guided core needle biopsy for mass 3:30 o'clock.  Continued clinical evaluation for right breast palpable concern.  I have discussed the findings and recommendations with the patient. Results were also provided in writing at the conclusion of the visit. If applicable, a reminder letter will be sent to the patient regarding the next appointment.  BI-RADS CATEGORY 4: Suspicious.   Electronically Signed By: Jamie Avila M.D. On: 03/08/2018 12:50   CLINICAL DATA: 57 year old female with an indeterminate solid and cystic left breast mass.  EXAM: ULTRASOUND GUIDED LEFT BREAST CORE NEEDLE BIOPSY  COMPARISON: Previous exam(s).  FINDINGS: I met with the patient and we discussed the procedure of ultrasound-guided biopsy, including benefits and alternatives. We discussed the high likelihood of a successful procedure. We discussed the risks of the procedure, including infection, bleeding, tissue injury, clip migration, and inadequate sampling. Informed written consent was given. The usual time-out protocol was performed immediately prior to the procedure.  Lesion quadrant: Lower outer quadrant  Using sterile technique and 1% Lidocaine as local anesthetic, under direct ultrasound visualization, a 12 gauge spring-loaded device was used to perform biopsy of a left breast mass at the 3:30 position using a lateral approach. At the conclusion of the procedure a ribbon shaped tissue marker clip was deployed into the biopsy cavity. Follow up 2 view mammogram was performed and dictated separately.  IMPRESSION: Ultrasound guided biopsy of the left breast. No apparent complications.  Electronically Signed: By: Jamie Avila M.D. On: 03/10/2018 15:47  CLINICAL DATA: 58 year old female status post ultrasound-guided biopsy of the left breast.  EXAM: DIAGNOSTIC LEFT MAMMOGRAM POST ULTRASOUND  BIOPSY  COMPARISON: Previous exam(s).  FINDINGS: Mammographic images were obtained following ultrasound guided  biopsy of left breast. Ribbon shaped clip is identified in the lateral aspect at middle depth. This is in the expected location.  IMPRESSION: Post biopsy clip in the expected location.  Final Assessment: Post Procedure Mammograms for Marker Placement   Electronically Signed By: Jamie Avila M.D. On: 03/10/2018 16:07  ADDENDUM: Pathology revealed INTRADUCTAL PAPILLOMA, FIBROCYSTIC CHANGE of the LEFT breast, 3:30 o'clock position. This was found to be concordant by Dr. Kristopher Avila, with excision recommended.  Pathology results were discussed with the patient by telephone. The patient reported doing well after the biopsy with tenderness at the site. Post biopsy instructions and care were reviewed and questions were answered. The patient was encouraged to call [facility] for any additional concerns.  Surgical consultation has been arranged with Dr. Georgette Dover at [facility] on March 17, 2018.  Pathology results reported by Jamie Acres, RN on 03/11/2018.   Electronically Signed By: Jamie Avila M.D. On: 03/11/2018 13:17   Past Surgical History Jamie Avila, [place]; 03/17/2018 2:20 PM) Breast Biopsy Left. Shoulder Surgery Right.  Diagnostic Studies History Jamie Avila, [place]; 03/17/2018 2:20 PM) Colonoscopy 5-10 years ago Mammogram within last year Pap Smear 1-5 years ago  Allergies Jamie Avila, RMA; 03/17/2018 1:53 PM) Penicillins Rash. Allergies Reconciled  Medication History [employer], RMA; 03/17/2018 1:54 PM) Vitamin D (Oral) Specific strength unknown - Active. Fish Oil (Oral) Specific strength unknown - Active. Calcium (Oral) Specific strength unknown - Active. Turmeric (Oral) Specific strength unknown - Active. Medications Reconciled  Social History Jamie Avila,  [place]; 03/17/2018 2:20 PM) Alcohol use Occasional alcohol use. Caffeine use Tea. No drug use Tobacco use Never smoker.  Family History Jamie Avila, [place]; 03/17/2018 2:20 PM) First Degree Relatives No pertinent family history  Pregnancy / Birth History Jamie Avila, [place]; 03/17/2018 2:20 PM) Age at menarche 72 years. Age of menopause 51-55 Contraceptive History Oral contraceptives. Gravida 3 Length (months) of breastfeeding 3-6 Maternal age 57-30 Para 3  Other Problems Jamie Avila, [place]; 03/17/2018 2:20 PM) Gastroesophageal Reflux Disease Lump In Breast     Review of Systems Jamie Avila CMA; 03/17/2018 2:20 PM) General Not Present- Appetite Loss, Chills, Fatigue, Fever, Night Sweats, Weight Gain and Weight Loss. Skin Not Present- Change in Wart/Mole, Dryness, Hives, Jaundice, New Lesions, Non-Healing Wounds, Rash and Ulcer. HEENT Present- Wears glasses/contact lenses. Not Present- Earache, Hearing Loss, Hoarseness, Nose Bleed, Oral Ulcers, Ringing in the Ears, Seasonal Allergies, Sinus Pain, Sore Throat, Visual Disturbances and Yellow Eyes. Respiratory Not Present- Bloody sputum, Chronic Cough, Difficulty Breathing, Snoring and Wheezing. Breast Present- Breast Mass. Not Present- Breast Pain, Nipple Discharge and Skin Changes. Cardiovascular Not Present- Chest Pain, Difficulty Breathing Lying Down, Leg Cramps, Palpitations, Rapid Heart Rate, Shortness of Breath and Swelling of Extremities. Gastrointestinal Not Present- Abdominal Pain, Bloating, Bloody Stool, Change in Bowel Habits, Chronic diarrhea, Constipation, Difficulty Swallowing, Excessive gas, Gets full quickly at meals, Hemorrhoids, Indigestion, Nausea, Rectal Pain and Vomiting. Female Genitourinary Not Present- Frequency, Nocturia, Painful Urination, Pelvic Pain and Urgency. Musculoskeletal Not Present- Back Pain, Joint Pain, Joint Stiffness, Muscle Pain, Muscle Weakness and Swelling of  Extremities. Neurological Not Present- Decreased Memory, Fainting, Headaches, Numbness, Seizures, Tingling, Tremor, Trouble walking and Weakness. Psychiatric Not Present- Anxiety, Bipolar, Change in Sleep Pattern, Depression, Fearful and Frequent crying. Endocrine Not Present- Cold Intolerance, Excessive Hunger, Hair Changes, Heat Intolerance, Hot flashes and New Diabetes. Hematology Not Present- Blood Thinners, Easy Bruising, Excessive bleeding, Gland problems, HIV and Persistent Infections.  Vitals Jamie Bers Haggett RMA; 03/17/2018  1:54 PM) 03/17/2018 1:54 PM Weight: 143 lb Height: 64in Body Surface Area: 1.7 m Body Mass Index: 24.55 kg/m  Temp.: 97.41F(Temporal)  Pulse: 90 (Regular)  P.OX: 99% (Room air) BP: 122/78 (Sitting, Right Arm, Standard)      Physical Exam Rodman Key K. Nikira Kushnir MD; 03/17/2018 5:29 PM)  The physical exam findings are as follows: Note:WDWN in NAD Eyes: Pupils equal, round; sclera anicteric HENT: Oral mucosa moist; good dentition Neck: No masses palpated, no thyromegaly Lungs: CTA bilaterally; normal respiratory effort Breasts: symmetric; no axillary lymphadenopathy; bilateral fibrocystic changes; no dominant masses; bruising/ hematoma in the lateral left breast; no nipple retraction or discharge CV: Regular rate and rhythm; no murmurs; extremities well-perfused with no edema Abd: +bowel sounds, soft, non-tender, no palpable organomegaly; no palpable hernias Skin: Warm, dry; no sign of jaundice Psychiatric - alert and oriented x 4; calm mood and affect    Assessment & Plan Rodman Key K. Julietta Batterman MD; 03/17/2018 5:30 PM)  INTRADUCTAL PAPILLOMA OF BREAST, LEFT (D24.2)  Current Plans Schedule for Surgery - Left radioactive seed localized lumpectomy. The surgical procedure has been discussed with the patient. Potential risks, benefits, alternative treatments, and expected outcomes have been explained. All of the patient's questions at this time have  been answered. The likelihood of reaching the patient's treatment goal is good. The patient understand the proposed surgical procedure and wishes to proceed. Note:The patient had many many questions, we spent over 35 minutes of this 45 minute office visit discussing her upcoming surgery and other options. She has agreed to proceed with excision.  The patient's son is an ENT at [employer].   Jamie Avila. Georgette Dover, MD, [facility]  General/ Trauma Surgery Beeper [phone]  03/17/2018 5:30 PM

## 2018-03-24 DIAGNOSIS — Z01419 Encounter for gynecological examination (general) (routine) without abnormal findings: Secondary | ICD-10-CM | POA: Diagnosis not present

## 2018-03-24 DIAGNOSIS — Z13820 Encounter for screening for osteoporosis: Secondary | ICD-10-CM | POA: Diagnosis not present

## 2018-03-24 DIAGNOSIS — Z6824 Body mass index (BMI) 24.0-24.9, adult: Secondary | ICD-10-CM | POA: Diagnosis not present

## 2018-03-28 ENCOUNTER — Other Ambulatory Visit: Payer: Self-pay | Admitting: Surgery

## 2018-03-28 DIAGNOSIS — D242 Benign neoplasm of left breast: Secondary | ICD-10-CM

## 2018-05-26 ENCOUNTER — Ambulatory Visit (HOSPITAL_BASED_OUTPATIENT_CLINIC_OR_DEPARTMENT_OTHER): Admit: 2018-05-26 | Payer: BLUE CROSS/BLUE SHIELD | Admitting: Surgery

## 2018-05-26 ENCOUNTER — Encounter (HOSPITAL_BASED_OUTPATIENT_CLINIC_OR_DEPARTMENT_OTHER): Payer: Self-pay

## 2018-05-26 SURGERY — BREAST LUMPECTOMY WITH RADIOACTIVE SEED LOCALIZATION
Anesthesia: General | Site: Breast | Laterality: Left

## 2018-08-10 ENCOUNTER — Other Ambulatory Visit (HOSPITAL_COMMUNITY)
Admission: RE | Admit: 2018-08-10 | Discharge: 2018-08-10 | Disposition: A | Discharge status: Home / Self Care | Payer: BC Managed Care – PPO | Source: Ambulatory Visit | Attending: Surgery | Admitting: Surgery

## 2018-08-10 ENCOUNTER — Other Ambulatory Visit: Payer: Self-pay

## 2018-08-10 ENCOUNTER — Encounter (HOSPITAL_BASED_OUTPATIENT_CLINIC_OR_DEPARTMENT_OTHER): Payer: Self-pay | Admitting: *Deleted

## 2018-08-10 DIAGNOSIS — D224 Melanocytic nevi of scalp and neck: Secondary | ICD-10-CM | POA: Diagnosis not present

## 2018-08-10 DIAGNOSIS — L814 Other melanin hyperpigmentation: Secondary | ICD-10-CM | POA: Diagnosis not present

## 2018-08-10 DIAGNOSIS — L821 Other seborrheic keratosis: Secondary | ICD-10-CM | POA: Diagnosis not present

## 2018-08-13 ENCOUNTER — Other Ambulatory Visit (HOSPITAL_COMMUNITY)
Admission: RE | Admit: 2018-08-13 | Discharge: 2018-08-13 | Disposition: A | Discharge status: Home / Self Care | Payer: BC Managed Care – PPO | Source: Ambulatory Visit | Attending: Surgery | Admitting: Surgery

## 2018-08-13 DIAGNOSIS — Z1159 Encounter for screening for other viral diseases: Secondary | ICD-10-CM | POA: Insufficient documentation

## 2018-08-13 LAB — SARS CORONAVIRUS 2 (TAT 6-24 HRS): SARS Coronavirus 2: NEGATIVE

## 2018-08-15 ENCOUNTER — Ambulatory Visit: Payer: Self-pay | Admitting: Surgery

## 2018-08-15 DIAGNOSIS — D242 Benign neoplasm of left breast: Secondary | ICD-10-CM

## 2018-08-16 ENCOUNTER — Ambulatory Visit
Admission: RE | Admit: 2018-08-16 | Discharge: 2018-08-16 | Disposition: A | Discharge status: Home / Self Care | Payer: BC Managed Care – PPO | Source: Ambulatory Visit | Attending: Surgery | Admitting: Surgery

## 2018-08-16 ENCOUNTER — Other Ambulatory Visit: Payer: Self-pay

## 2018-08-16 DIAGNOSIS — D242 Benign neoplasm of left breast: Secondary | ICD-10-CM

## 2018-08-16 NOTE — Progress Notes (Signed)
Ensure pre surgery drink and surgical soap given with instructions, pt verbalized understanding. 

## 2018-08-17 ENCOUNTER — Ambulatory Visit (HOSPITAL_BASED_OUTPATIENT_CLINIC_OR_DEPARTMENT_OTHER)
Admission: RE | Admit: 2018-08-17 | Discharge: 2018-08-17 | Disposition: A | Discharge status: Home / Self Care | Payer: BC Managed Care – PPO | Attending: Surgery | Admitting: Surgery

## 2018-08-17 ENCOUNTER — Ambulatory Visit
Admission: RE | Admit: 2018-08-17 | Discharge: 2018-08-17 | Disposition: A | Discharge status: Home / Self Care | Payer: BC Managed Care – PPO | Source: Ambulatory Visit | Attending: Surgery | Admitting: Surgery

## 2018-08-17 ENCOUNTER — Encounter (HOSPITAL_BASED_OUTPATIENT_CLINIC_OR_DEPARTMENT_OTHER)
Admission: RE | Disposition: A | Discharge status: Home / Self Care | Payer: Self-pay | Source: Home / Self Care | Attending: Surgery

## 2018-08-17 ENCOUNTER — Ambulatory Visit (HOSPITAL_BASED_OUTPATIENT_CLINIC_OR_DEPARTMENT_OTHER): Payer: BC Managed Care – PPO | Admitting: Certified Registered"

## 2018-08-17 DIAGNOSIS — D242 Benign neoplasm of left breast: Secondary | ICD-10-CM | POA: Insufficient documentation

## 2018-08-17 DIAGNOSIS — N6082 Other benign mammary dysplasias of left breast: Secondary | ICD-10-CM | POA: Diagnosis not present

## 2018-08-17 DIAGNOSIS — E785 Hyperlipidemia, unspecified: Secondary | ICD-10-CM | POA: Diagnosis not present

## 2018-08-17 HISTORY — DX: Unspecified lump in the left breast, unspecified quadrant: N63.20

## 2018-08-17 HISTORY — PX: BREAST LUMPECTOMY WITH RADIOACTIVE SEED LOCALIZATION: SHX6424

## 2018-08-17 HISTORY — DX: Gastro-esophageal reflux disease without esophagitis: K21.9

## 2018-08-17 SURGERY — BREAST LUMPECTOMY WITH RADIOACTIVE SEED LOCALIZATION
Anesthesia: General | Site: Breast | Laterality: Left

## 2018-08-17 MED ORDER — ACETAMINOPHEN 500 MG PO TABS
ORAL_TABLET | ORAL | Status: AC
  Filled 2018-08-17: qty 2

## 2018-08-17 MED ORDER — FENTANYL CITRATE (PF) 100 MCG/2ML IJ SOLN: 25.0000 ug | INTRAMUSCULAR | Status: DC | PRN

## 2018-08-17 MED ORDER — OXYCODONE HCL 5 MG/5ML PO SOLN: 5.0000 mg | Freq: Once | ORAL | Status: DC | PRN

## 2018-08-17 MED ORDER — CELECOXIB 200 MG PO CAPS
200.0000 mg | ORAL_CAPSULE | ORAL | Status: AC
  Administered 2018-08-17: 09:00:00 200 mg via ORAL

## 2018-08-17 MED ORDER — PROPOFOL 500 MG/50ML IV EMUL
INTRAVENOUS | Status: DC | PRN
  Administered 2018-08-17: 100 ug/kg/min via INTRAVENOUS

## 2018-08-17 MED ORDER — CEFAZOLIN SODIUM-DEXTROSE 2-4 GM/100ML-% IV SOLN
2.0000 g | INTRAVENOUS | Status: AC
  Administered 2018-08-17: 2 g via INTRAVENOUS

## 2018-08-17 MED ORDER — PROPOFOL 10 MG/ML IV BOLUS
INTRAVENOUS | Status: DC | PRN
  Administered 2018-08-17: 150 mg via INTRAVENOUS

## 2018-08-17 MED ORDER — ACETAMINOPHEN 500 MG PO TABS
1000.0000 mg | ORAL_TABLET | ORAL | Status: AC
  Administered 2018-08-17: 1000 mg via ORAL

## 2018-08-17 MED ORDER — CHLORHEXIDINE GLUCONATE CLOTH 2 % EX PADS: 6.0000 | MEDICATED_PAD | Freq: Once | CUTANEOUS | Status: DC

## 2018-08-17 MED ORDER — MIDAZOLAM HCL 2 MG/2ML IJ SOLN
1.0000 mg | INTRAMUSCULAR | Status: DC | PRN
  Administered 2018-08-17: 2 mg via INTRAVENOUS

## 2018-08-17 MED ORDER — CELECOXIB 200 MG PO CAPS
ORAL_CAPSULE | ORAL | Status: AC
  Filled 2018-08-17: qty 1

## 2018-08-17 MED ORDER — MIDAZOLAM HCL 2 MG/2ML IJ SOLN
INTRAMUSCULAR | Status: AC
  Filled 2018-08-17: qty 2

## 2018-08-17 MED ORDER — DEXAMETHASONE SODIUM PHOSPHATE 4 MG/ML IJ SOLN
INTRAMUSCULAR | Status: DC | PRN
  Administered 2018-08-17: 10 mg via INTRAVENOUS

## 2018-08-17 MED ORDER — HYDROCODONE-ACETAMINOPHEN 5-325 MG PO TABS: 1.0000 | ORAL_TABLET | Freq: Four times a day (QID) | ORAL | 0 refills | Status: DC | PRN

## 2018-08-17 MED ORDER — GABAPENTIN 300 MG PO CAPS
300.0000 mg | ORAL_CAPSULE | ORAL | Status: AC
  Administered 2018-08-17: 300 mg via ORAL

## 2018-08-17 MED ORDER — ACETAMINOPHEN 10 MG/ML IV SOLN: 1000.0000 mg | Freq: Once | INTRAVENOUS | Status: DC | PRN

## 2018-08-17 MED ORDER — CEFAZOLIN SODIUM-DEXTROSE 2-4 GM/100ML-% IV SOLN
INTRAVENOUS | Status: AC
  Filled 2018-08-17: qty 100

## 2018-08-17 MED ORDER — OXYCODONE HCL 5 MG PO TABS: 5.0000 mg | ORAL_TABLET | Freq: Once | ORAL | Status: DC | PRN

## 2018-08-17 MED ORDER — FENTANYL CITRATE (PF) 100 MCG/2ML IJ SOLN
50.0000 ug | INTRAMUSCULAR | Status: DC | PRN
  Administered 2018-08-17: 50 ug via INTRAVENOUS

## 2018-08-17 MED ORDER — ACETAMINOPHEN 160 MG/5ML PO SOLN: 1000.0000 mg | Freq: Once | ORAL | Status: DC | PRN

## 2018-08-17 MED ORDER — GABAPENTIN 300 MG PO CAPS
ORAL_CAPSULE | ORAL | Status: AC
  Filled 2018-08-17: qty 1

## 2018-08-17 MED ORDER — LIDOCAINE HCL (CARDIAC) PF 100 MG/5ML IV SOSY
PREFILLED_SYRINGE | INTRAVENOUS | Status: DC | PRN
  Administered 2018-08-17: 60 mg via INTRAVENOUS

## 2018-08-17 MED ORDER — ACETAMINOPHEN 500 MG PO TABS: 1000.0000 mg | ORAL_TABLET | Freq: Once | ORAL | Status: DC | PRN

## 2018-08-17 MED ORDER — SCOPOLAMINE 1 MG/3DAYS TD PT72: 1.0000 | MEDICATED_PATCH | Freq: Once | TRANSDERMAL | Status: DC

## 2018-08-17 MED ORDER — LACTATED RINGERS IV SOLN
INTRAVENOUS | Status: DC
  Administered 2018-08-17 (×2): via INTRAVENOUS

## 2018-08-17 MED ORDER — FENTANYL CITRATE (PF) 100 MCG/2ML IJ SOLN
INTRAMUSCULAR | Status: AC
  Filled 2018-08-17: qty 2

## 2018-08-17 MED ORDER — ONDANSETRON HCL 4 MG/2ML IJ SOLN
INTRAMUSCULAR | Status: DC | PRN
  Administered 2018-08-17: 4 mg via INTRAVENOUS

## 2018-08-17 MED ORDER — BUPIVACAINE-EPINEPHRINE 0.25% -1:200000 IJ SOLN
INTRAMUSCULAR | Status: DC | PRN
  Administered 2018-08-17: 10 mL

## 2018-08-17 SURGICAL SUPPLY — 52 items
APPLIER CLIP 9.375 MED OPEN (MISCELLANEOUS) ×2
BENZOIN TINCTURE PRP APPL 2/3 (GAUZE/BANDAGES/DRESSINGS) ×2 IMPLANT
BLADE HEX COATED 2.75 (ELECTRODE) ×2 IMPLANT
BLADE SURG 15 STRL LF DISP TIS (BLADE) ×1 IMPLANT
BLADE SURG 15 STRL SS (BLADE) ×1
CANISTER SUC SOCK COL 7IN (MISCELLANEOUS) IMPLANT
CANISTER SUCT 1200ML W/VALVE (MISCELLANEOUS) IMPLANT
CHLORAPREP W/TINT 26 (MISCELLANEOUS) ×2 IMPLANT
CLIP APPLIE 9.375 MED OPEN (MISCELLANEOUS) ×1 IMPLANT
COVER BACK TABLE REUSABLE LG (DRAPES) ×2 IMPLANT
COVER MAYO STAND REUSABLE (DRAPES) ×2 IMPLANT
COVER PROBE W GEL 5X96 (DRAPES) ×2 IMPLANT
COVER WAND RF STERILE (DRAPES) IMPLANT
DECANTER SPIKE VIAL GLASS SM (MISCELLANEOUS) IMPLANT
DRAPE LAPAROTOMY 100X72 PEDS (DRAPES) ×2 IMPLANT
DRAPE UTILITY XL STRL (DRAPES) ×2 IMPLANT
DRSG TEGADERM 4X4.75 (GAUZE/BANDAGES/DRESSINGS) ×2 IMPLANT
ELECT REM PT RETURN 9FT ADLT (ELECTROSURGICAL) ×2
ELECTRODE REM PT RTRN 9FT ADLT (ELECTROSURGICAL) ×1 IMPLANT
GAUZE SPONGE 4X4 12PLY STRL LF (GAUZE/BANDAGES/DRESSINGS) IMPLANT
GLOVE BIO SURGEON STRL SZ7 (GLOVE) ×2 IMPLANT
GLOVE BIOGEL PI IND STRL 6.5 (GLOVE) ×1 IMPLANT
GLOVE BIOGEL PI IND STRL 7.0 (GLOVE) ×1 IMPLANT
GLOVE BIOGEL PI IND STRL 7.5 (GLOVE) ×1 IMPLANT
GLOVE BIOGEL PI INDICATOR 6.5 (GLOVE) ×1
GLOVE BIOGEL PI INDICATOR 7.0 (GLOVE) ×1
GLOVE BIOGEL PI INDICATOR 7.5 (GLOVE) ×1
GLOVE SURG SS PI 6.5 STRL IVOR (GLOVE) ×2 IMPLANT
GOWN STRL REUS W/ TWL LRG LVL3 (GOWN DISPOSABLE) ×2 IMPLANT
GOWN STRL REUS W/TWL LRG LVL3 (GOWN DISPOSABLE) ×2
ILLUMINATOR WAVEGUIDE N/F (MISCELLANEOUS) IMPLANT
KIT MARKER MARGIN INK (KITS) ×2 IMPLANT
LIGHT WAVEGUIDE WIDE FLAT (MISCELLANEOUS) IMPLANT
NEEDLE HYPO 25X1 1.5 SAFETY (NEEDLE) ×2 IMPLANT
NS IRRIG 1000ML POUR BTL (IV SOLUTION) ×2 IMPLANT
PACK BASIN DAY SURGERY FS (CUSTOM PROCEDURE TRAY) ×2 IMPLANT
PENCIL BUTTON HOLSTER BLD 10FT (ELECTRODE) ×2 IMPLANT
SLEEVE SCD COMPRESS KNEE MED (MISCELLANEOUS) ×2 IMPLANT
SPONGE GAUZE 2X2 8PLY STRL LF (GAUZE/BANDAGES/DRESSINGS) IMPLANT
SPONGE LAP 18X18 RF (DISPOSABLE) IMPLANT
SPONGE LAP 4X18 RFD (DISPOSABLE) ×2 IMPLANT
STRIP CLOSURE SKIN 1/2X4 (GAUZE/BANDAGES/DRESSINGS) ×2 IMPLANT
SUT MON AB 4-0 PC3 18 (SUTURE) ×2 IMPLANT
SUT SILK 2 0 SH (SUTURE) IMPLANT
SUT VIC AB 3-0 SH 27 (SUTURE) ×1
SUT VIC AB 3-0 SH 27X BRD (SUTURE) ×1 IMPLANT
SYR BULB 3OZ (MISCELLANEOUS) IMPLANT
SYR CONTROL 10ML LL (SYRINGE) ×2 IMPLANT
TOWEL GREEN STERILE FF (TOWEL DISPOSABLE) ×2 IMPLANT
TRAY FAXITRON CT DISP (TRAY / TRAY PROCEDURE) ×2 IMPLANT
TUBE CONNECTING 20X1/4 (TUBING) IMPLANT
YANKAUER SUCT BULB TIP NO VENT (SUCTIONS) IMPLANT

## 2018-08-17 NOTE — Discharge Instructions (Signed)
[facility] °Office Phone Number [phone] ° °BREAST BIOPSY/ PARTIAL MASTECTOMY: POST OP INSTRUCTIONS ° °Always review your discharge instruction sheet given to you by the facility where your surgery was performed. ° °IF YOU HAVE DISABILITY OR FAMILY LEAVE FORMS, YOU MUST BRING THEM TO THE OFFICE FOR PROCESSING.  DO NOT GIVE THEM TO YOUR DOCTOR. ° °1. A prescription for pain medication may be given to you upon discharge.  Take your pain medication as prescribed, if needed.  If narcotic pain medicine is not needed, then you may take acetaminophen (Tylenol) or ibuprofen (Advil) as needed. °2. Take your usually prescribed medications unless otherwise directed °3. If you need a refill on your pain medication, please contact your pharmacy.  They will contact our office to request authorization.  Prescriptions will not be filled after 5pm or on week-ends. °4. You should eat very light the first 24 hours after surgery, such as soup, crackers, pudding, etc.  Resume your normal diet the day after surgery. °5. Most patients will experience some swelling and bruising in the breast.  Ice packs and a good support bra will help.  Swelling and bruising can take several days to resolve.  °6. It is common to experience some constipation if taking pain medication after surgery.  Increasing fluid intake and taking a stool softener will usually help or prevent this problem from occurring.  A mild laxative (Milk of Magnesia or Miralax) should be taken according to package directions if there are no bowel movements after 48 hours. °7. Unless discharge instructions indicate otherwise, you may remove your bandages 24-48 hours after surgery, and you may shower at that time.  You may have steri-strips (small skin tapes) in place directly over the incision.  These strips should be left on the skin for 7-10 days.  If your surgeon used skin glue on the incision, you may shower in 24 hours.  The glue will flake off over the  next 2-3 weeks.  Any sutures or staples will be removed at the office during your follow-up visit. °8. ACTIVITIES:  You may resume regular daily activities (gradually increasing) beginning the next day.  Wearing a good support bra or sports bra minimizes pain and swelling.  You may have sexual intercourse when it is comfortable. °a. You may drive when you no longer are taking prescription pain medication, you can comfortably wear a seatbelt, and you can safely maneuver your car and apply brakes. °b. RETURN TO WORK:  ______________________________________________________________________________________ °9. You should see your doctor in the office for a follow-up appointment approximately two weeks after your surgery.  Your doctor’s nurse will typically make your follow-up appointment when she calls you with your pathology report.  Expect your pathology report 2-3 business days after your surgery.  You may call to check if you do not hear from us after three days. °10. OTHER INSTRUCTIONS: _______________________________________________________________________________________________ _____________________________________________________________________________________________________________________________________ °_____________________________________________________________________________________________________________________________________ °_____________________________________________________________________________________________________________________________________ ° °WHEN TO CALL YOUR DOCTOR: °1. Fever over 101.0 °2. Nausea and/or vomiting. °3. Extreme swelling or bruising. °4. Continued bleeding from incision. °5. Increased pain, redness, or drainage from the incision. ° °The clinic staff is available to answer your questions during regular business hours.  Please don’t hesitate to call and ask to speak to one of the nurses for clinical concerns.  If you have a medical emergency, go to the nearest  emergency room or call 911.  A surgeon from [facility] is always on call at the hospital. ° °For further questions, please visit [email or web address]  ° ° ° ° °  Post Anesthesia Home Care Instructions ° °Activity: °Get plenty of rest for the remainder of the day. A responsible individual must stay with you for 24 hours following the procedure.  °For the next 24 hours, DO NOT: °-Drive a car °-Operate machinery °-Drink alcoholic beverages °-Take any medication unless instructed by your physician °-Make any legal decisions or sign important papers. ° °Meals: °Start with liquid foods such as gelatin or soup. Progress to regular foods as tolerated. Avoid greasy, spicy, heavy foods. If nausea and/or vomiting occur, drink only clear liquids until the nausea and/or vomiting subsides. Call your physician if vomiting continues. ° °Special Instructions/Symptoms: °Your throat may feel dry or sore from the anesthesia or the breathing tube placed in your throat during surgery. If this causes discomfort, gargle with warm salt water. The discomfort should disappear within 24 hours. ° °If you had a scopolamine patch placed behind your ear for the management of post- operative nausea and/or vomiting: ° °1. The medication in the patch is effective for 72 hours, after which it should be removed.  Wrap patch in a tissue and discard in the trash. Wash hands thoroughly with soap and water. °2. You may remove the patch earlier than 72 hours if you experience unpleasant side effects which may include dry mouth, dizziness or visual disturbances. °3. Avoid touching the patch. Wash your hands with soap and water after contact with the patch. °  ° °

## 2018-08-17 NOTE — H&P (Signed)
History of Present Illness The patient is a 58 year old female who presents with a breast mass. Referred by Dr. Radene Knee for left intraductal papilloma  This is a 58 year old female who recently mentioned to her GYN about some mild thickening in the lower right breast. She underwent diagnostic mammograms. The right breast was normal. However the left lateral breast shows a 1.4 cm oval circumscribed mass at 3:30. This was confirmed with ultrasound. There is an adjacent cystic mass. She underwent core biopsy and the diagnosis returned intraductal papilloma. She is referred now for surgical evaluation.  Menarche age 39 First pregnancy age 51 Breast-feed yes Hormones oral contraceptives 20 years hormone supplements 3 years Menopause age 43 Family history is negative for breast cancer  CLINICAL DATA: Patient presents for palpable lumpiness within the inferior right breast.  EXAM: DIGITAL DIAGNOSTIC BILATERAL MAMMOGRAM WITH CAD AND TOMO  ULTRASOUND BILATERAL BREAST  COMPARISON: Previous exam(s).  ACR Breast Density Category c: The breast tissue is heterogeneously dense, which may obscure small masses.  FINDINGS: There is a new oval circumscribed mass within the lateral aspect of the left breast. No additional concerning masses, calcifications or distortion identified within either breast.  Mammographic images were processed with CAD.  On physical exam, I palpate dense tissue within the inferior right breast.  Targeted ultrasound is performed, showing a 1.4 x 0.3 x 0.5 cm solid and cystic mass left breast 3:30 o'clock 3 cm from nipple. This is felt to correspond with mammographic abnormality. No left axillary adenopathy.  No suspicious abnormality within the right breast inferior mammary fold at the site of palpable concern.  IMPRESSION: Indeterminate solid and cystic left breast mass 3:30 o'clock.  No suspicious abnormality at the site of palpable  concern right breast.  RECOMMENDATION: Left breast ultrasound-guided core needle biopsy for mass 3:30 o'clock.  Continued clinical evaluation for right breast palpable concern.  I have discussed the findings and recommendations with the patient. Results were also provided in writing at the conclusion of the visit. If applicable, a reminder letter will be sent to the patient regarding the next appointment.  BI-RADS CATEGORY 4: Suspicious.   Electronically Signed By: Lovey Newcomer M.D. On: 03/08/2018 12:50   CLINICAL DATA: 58 year old female with an indeterminate solid and cystic left breast mass.  EXAM: ULTRASOUND GUIDED LEFT BREAST CORE NEEDLE BIOPSY  COMPARISON: Previous exam(s).  FINDINGS: I met with the patient and we discussed the procedure of ultrasound-guided biopsy, including benefits and alternatives. We discussed the high likelihood of a successful procedure. We discussed the risks of the procedure, including infection, bleeding, tissue injury, clip migration, and inadequate sampling. Informed written consent was given. The usual time-out protocol was performed immediately prior to the procedure.  Lesion quadrant: Lower outer quadrant  Using sterile technique and 1% Lidocaine as local anesthetic, under direct ultrasound visualization, a 12 gauge spring-loaded device was used to perform biopsy of a left breast mass at the 3:30 position using a lateral approach. At the conclusion of the procedure a ribbon shaped tissue marker clip was deployed into the biopsy cavity. Follow up 2 view mammogram was performed and dictated separately.  IMPRESSION: Ultrasound guided biopsy of the left breast. No apparent complications.  Electronically Signed: By: Kristopher Oppenheim M.D. On: 03/10/2018 15:47  CLINICAL DATA: 58 year old female status post ultrasound-guided biopsy of the left breast.  EXAM: DIAGNOSTIC LEFT MAMMOGRAM POST ULTRASOUND  BIOPSY  COMPARISON: Previous exam(s).  FINDINGS: Mammographic images were obtained following ultrasound guided biopsy of left breast. Ribbon shaped clip  is identified in the lateral aspect at middle depth. This is in the expected location.  IMPRESSION: Post biopsy clip in the expected location.  Final Assessment: Post Procedure Mammograms for Marker Placement   Electronically Signed By: Kristopher Oppenheim M.D. On: 03/10/2018 16:07  ADDENDUM: Pathology revealed INTRADUCTAL PAPILLOMA, FIBROCYSTIC CHANGE of the LEFT breast, 3:30 o'clock position. This was found to be concordant by Dr. Kristopher Oppenheim, with excision recommended.  Pathology results were discussed with the patient by telephone. The patient reported doing well after the biopsy with tenderness at the site. Post biopsy instructions and care were reviewed and questions were answered. The patient was encouraged to call [facility] for any additional concerns.  Surgical consultation has been arranged with Dr. Georgette Dover at [facility] on March 17, 2018.  Pathology results reported by [name], RN on 03/11/2018.   Electronically Signed By: Kristopher Oppenheim M.D. On: 03/11/2018 13:17   Past Surgical History  Breast Biopsy Left. Shoulder Surgery Right.  Diagnostic Studies History  Colonoscopy 5-10 years ago Mammogram within last year Pap Smear 1-5 years ago  Allergies  Penicillins Rash. Allergies Reconciled  Medication History  Vitamin D (Oral) Specific strength unknown - Active. Fish Oil (Oral) Specific strength unknown - Active. Calcium (Oral) Specific strength unknown - Active. Turmeric (Oral) Specific strength unknown - Active. Medications Reconciled  Social History  Alcohol use Occasional alcohol use. Caffeine use Tea. No drug use Tobacco use Never smoker.  Family History  First Degree Relatives No pertinent family  history  Pregnancy / Birth History  Age at menarche 9 years. Age of menopause [age] Contraceptive History Oral contraceptives. Gravida 3 Length (months) of breastfeeding 3-6 Maternal age 16-30 Para 3  Other Problems  Gastroesophageal Reflux Disease Lump In Breast     Review of Systems General Not Present- Appetite Loss, Chills, Fatigue, Fever, Night Sweats, Weight Gain and Weight Loss. Skin Not Present- Change in Wart/Mole, Dryness, Hives, Jaundice, New Lesions, Non-Healing Wounds, Rash and Ulcer. HEENT Present- Wears glasses/contact lenses. Not Present- Earache, Hearing Loss, Hoarseness, Nose Bleed, Oral Ulcers, Ringing in the Ears, Seasonal Allergies, Sinus Pain, Sore Throat, Visual Disturbances and Yellow Eyes. Respiratory Not Present- Bloody sputum, Chronic Cough, Difficulty Breathing, Snoring and Wheezing. Breast Present- Breast Mass. Not Present- Breast Pain, Nipple Discharge and Skin Changes. Cardiovascular Not Present- Chest Pain, Difficulty Breathing Lying Down, Leg Cramps, Palpitations, Rapid Heart Rate, Shortness of Breath and Swelling of Extremities. Gastrointestinal Not Present- Abdominal Pain, Bloating, Bloody Stool, Change in Bowel Habits, Chronic diarrhea, Constipation, Difficulty Swallowing, Excessive gas, Gets full quickly at meals, Hemorrhoids, Indigestion, Nausea, Rectal Pain and Vomiting. Female Genitourinary Not Present- Frequency, Nocturia, Painful Urination, Pelvic Pain and Urgency. Musculoskeletal Not Present- Back Pain, Joint Pain, Joint Stiffness, Muscle Pain, Muscle Weakness and Swelling of Extremities. Neurological Not Present- Decreased Memory, Fainting, Headaches, Numbness, Seizures, Tingling, Tremor, Trouble walking and Weakness. Psychiatric Not Present- Anxiety, Bipolar, Change in Sleep Pattern, Depression, Fearful and Frequent crying. Endocrine Not Present- Cold Intolerance, Excessive Hunger, Hair Changes, Heat Intolerance, Hot flashes  and New Diabetes. Hematology Not Present- Blood Thinners, Easy Bruising, Excessive bleeding, Gland problems, HIV and Persistent Infections.  Vitals  Weight: 143 lb Height: 64in Body Surface Area: 1.7 m Body Mass Index: 24.55 kg/m  Temp.: 97.2F(Temporal)  Pulse: 90 (Regular)  P.OX: 99% (Room air) BP: 122/78 (Sitting, Right Arm, Standard)      Physical Exam   The physical exam findings are as follows: Note:WDWN in NAD Eyes: Pupils equal, round;  sclera anicteric HENT: Oral mucosa moist; good dentition Neck: No masses palpated, no thyromegaly Lungs: CTA bilaterally; normal respiratory effort Breasts: symmetric; no axillary lymphadenopathy; bilateral fibrocystic changes; no dominant masses; bruising/ hematoma in the lateral left breast; no nipple retraction or discharge CV: Regular rate and rhythm; no murmurs; extremities well-perfused with no edema Abd: +bowel sounds, soft, non-tender, no palpable organomegaly; no palpable hernias Skin: Warm, dry; no sign of jaundice Psychiatric - alert and oriented x 4; calm mood and affect    Assessment & Plan   INTRADUCTAL PAPILLOMA OF BREAST, LEFT (D24.2)  Current Plans Schedule for Surgery - Left radioactive seed localized lumpectomy. The surgical procedure has been discussed with the patient. Potential risks, benefits, alternative treatments, and expected outcomes have been explained. All of the patient's questions at this time have been answered. The likelihood of reaching the patient's treatment goal is good. The patient understand the proposed surgical procedure and wishes to proceed. Note:The patient had many many questions, we spent over 35 minutes of this 45 minute office visit discussing her upcoming surgery and other options. She has agreed to proceed with excision.  The patient's son is an ENT at [employer].    Imogene Burn. Georgette Dover, MD, [facility] [phone]  08/17/2018 8:22 AM

## 2018-08-17 NOTE — Anesthesia Preprocedure Evaluation (Signed)
Anesthesia Evaluation  Patient identified by MRN, date of birth, ID band Patient awake    Reviewed: Allergy & Precautions, NPO status , Patient's Chart, lab work & pertinent test results  History of Anesthesia Complications Negative for: history of anesthetic complications  Airway Mallampati: II  TM Distance: >3 FB Neck ROM: Full    Dental  (+) Dental Advisory Given   Pulmonary neg pulmonary ROS,    breath sounds clear to auscultation       Cardiovascular negative cardio ROS   Rhythm:Regular     Neuro/Psych negative neurological ROS  negative psych ROS   GI/Hepatic Neg liver ROS, GERD  Controlled,  Endo/Other  negative endocrine ROS  Renal/GU negative Renal ROS     Musculoskeletal   Abdominal   Peds  Hematology negative hematology ROS (+)   Anesthesia Other Findings   Reproductive/Obstetrics                             Anesthesia Physical Anesthesia Plan  ASA: II  Anesthesia Plan: General   Post-op Pain Management:    Induction: Intravenous  PONV Risk Score and Plan: 3 and Ondansetron, Dexamethasone, Propofol infusion and TIVA  Airway Management Planned: LMA  Additional Equipment: None  Intra-op Plan:   Post-operative Plan: Extubation in OR  Informed Consent: I have reviewed the patients History and Physical, chart, labs and discussed the procedure including the risks, benefits and alternatives for the proposed anesthesia with the patient or authorized representative who has indicated his/her understanding and acceptance.     Dental advisory given  Plan Discussed with: CRNA and Surgeon  Anesthesia Plan Comments:         Anesthesia Quick Evaluation

## 2018-08-17 NOTE — Anesthesia Procedure Notes (Signed)
Procedure Name: LMA Insertion Date/Time: 08/17/2018 9:37 AM Performed by: Signe Colt, CRNA Pre-anesthesia Checklist: Patient identified, Emergency Drugs available, Suction available and Patient being monitored Patient Re-evaluated:Patient Re-evaluated prior to induction Oxygen Delivery Method: Circle system utilized Preoxygenation: Pre-oxygenation with 100% oxygen Induction Type: IV induction Ventilation: Mask ventilation without difficulty LMA: LMA inserted LMA Size: 4.0 Number of attempts: 1 Airway Equipment and Method: Bite block Placement Confirmation: positive ETCO2 Tube secured with: Tape Dental Injury: Teeth and Oropharynx as per pre-operative assessment

## 2018-08-17 NOTE — Op Note (Signed)
Pre-op Diagnosis:  Intraductal papilloma left breast Post-op Diagnosis: same Procedure:  Left radioactive seed localized lumpectomy Surgeon:  Jnyah Brazee K. Anesthesia:  GEN - LMA Indications:   This is a 58 year old female who recently mentioned to her GYN about some mild thickening in the lower right breast. She underwent diagnostic mammograms. The right breast was normal. However the left lateral breast shows a 1.4 cm oval circumscribed mass at 3:30. This was confirmed with ultrasound. There is an adjacent cystic mass. She underwent core biopsy and the diagnosis returned intraductal papilloma. She is referred now for surgical evaluation.  Description of procedure: The patient is brought to the operating room placed in supine position on the operating room table. After an adequate level of general anesthesia was obtained, her left breast was prepped with ChloraPrep and draped in sterile fashion. A timeout was taken to ensure the proper patient and proper procedure. We interrogated the breast with the neoprobe. We made a circumareolar incision around the lateral side of the nipple after infiltrating with 0.25% Marcaine. Dissection was carried down in the breast tissue with cautery. We used the neoprobe to guide [place] towards the radioactive seed. We excised an area of tissue around the radioactive seed 2 cm diameter. The specimen was removed and was oriented with a paint kit. Specimen mammogram showed the radioactive seed as well as the biopsy clip within the specimen. This was sent for pathologic examination. There is no residual radioactivity within the biopsy cavity. We inspected carefully for hemostasis. The wound was thoroughly irrigated. The wound was closed with a deep layer of 3-0 Vicryl and a subcuticular layer of 4-0 Monocryl. Benzoin Steri-Strips were applied. The patient was then extubated and brought to the recovery room in stable condition. All sponge, instrument, and needle counts are  correct.  Jamie Avila. Georgette Dover, MD, [facility]  General/ Trauma Surgery  08/17/2018 10:23 AM

## 2018-08-17 NOTE — Transfer of Care (Signed)
Immediate Anesthesia Transfer of Care Note  Patient: Coral Ceo  Procedure(s) Performed: LEFT BREAST LUMPECTOMY WITH RADIOACTIVE SEED LOCALIZATION (Left Breast)  Patient Location: PACU  Anesthesia Type:General  Level of Consciousness: drowsy and patient cooperative  Airway & Oxygen Therapy: Patient Spontanous Breathing and Patient connected to nasal cannula oxygen  Post-op Assessment: Report given to RN and Post -op Vital signs reviewed and stable  Post vital signs: Reviewed and stable  Last Vitals:  Vitals Value Taken Time  BP    Temp    Pulse    Resp    SpO2      Last Pain: There were no vitals filed for this visit.       Complications: No apparent anesthesia complications

## 2018-08-18 ENCOUNTER — Encounter (HOSPITAL_BASED_OUTPATIENT_CLINIC_OR_DEPARTMENT_OTHER): Payer: Self-pay | Admitting: Surgery

## 2018-08-18 NOTE — Anesthesia Postprocedure Evaluation (Signed)
Anesthesia Post Note  Patient: Jamie Avila  Procedure(s) Performed: LEFT BREAST LUMPECTOMY WITH RADIOACTIVE SEED LOCALIZATION (Left Breast)     Patient location during evaluation: PACU Anesthesia Type: General Level of consciousness: awake and alert Pain management: pain level controlled Vital Signs Assessment: post-procedure vital signs reviewed and stable Respiratory status: spontaneous breathing, nonlabored ventilation, respiratory function stable and patient connected to nasal cannula oxygen Cardiovascular status: blood pressure returned to baseline and stable Postop Assessment: no apparent nausea or vomiting Anesthetic complications: no    Last Vitals:  Vitals:   08/17/18 1115 08/17/18 1130  BP: 110/71 127/74  Pulse: 73 80  Resp: 16 18  Temp:  36.7 C  SpO2: 98% 100%    Last Pain:  Vitals:   08/17/18 1130  PainSc: 1                  Keola Heninger

## 2018-10-19 DIAGNOSIS — Z86018 Personal history of other benign neoplasm: Secondary | ICD-10-CM | POA: Diagnosis not present

## 2018-10-19 DIAGNOSIS — Z85828 Personal history of other malignant neoplasm of skin: Secondary | ICD-10-CM | POA: Diagnosis not present

## 2018-10-19 DIAGNOSIS — D2271 Melanocytic nevi of right lower limb, including hip: Secondary | ICD-10-CM | POA: Diagnosis not present

## 2018-10-19 DIAGNOSIS — D485 Neoplasm of uncertain behavior of skin: Secondary | ICD-10-CM | POA: Diagnosis not present

## 2018-10-19 DIAGNOSIS — L738 Other specified follicular disorders: Secondary | ICD-10-CM | POA: Diagnosis not present

## 2018-10-19 DIAGNOSIS — Z23 Encounter for immunization: Secondary | ICD-10-CM | POA: Diagnosis not present

## 2018-11-10 ENCOUNTER — Telehealth: Payer: Self-pay | Admitting: Family Medicine

## 2018-11-10 DIAGNOSIS — R220 Localized swelling, mass and lump, head: Secondary | ICD-10-CM

## 2018-11-10 DIAGNOSIS — Z789 Other specified health status: Secondary | ICD-10-CM

## 2018-11-10 NOTE — Telephone Encounter (Signed)
Please advise. Would you prefer the patient have an appointment for this?

## 2018-11-10 NOTE — Telephone Encounter (Signed)
She needs to see an allergist. Please put in referral for this and I am ok with epi pen rx to [facility] in meanwhile. If she is having this today, please get details and I would be ok with seeing her.

## 2018-11-10 NOTE — Telephone Encounter (Signed)
Patient experiences her lips and tongue swelling every other week. She thinks she might need a Epi Pen and wanted a prescription for one and have it sent to her preferred pharmacy [facility]

## 2018-11-11 MED ORDER — EPINEPHRINE 0.3 MG/0.3ML IJ SOAJ: 0.3000 mg | INTRAMUSCULAR | 0 refills | Status: DC | PRN

## 2018-11-11 NOTE — Telephone Encounter (Signed)
RX ordered referral placed.

## 2019-02-06 ENCOUNTER — Encounter: Payer: Self-pay | Admitting: Family Medicine

## 2019-02-13 ENCOUNTER — Ambulatory Visit: Payer: BC Managed Care – PPO | Attending: Internal Medicine

## 2019-02-13 DIAGNOSIS — Z20822 Contact with and (suspected) exposure to covid-19: Secondary | ICD-10-CM

## 2019-02-14 ENCOUNTER — Other Ambulatory Visit: Payer: BC Managed Care – PPO

## 2019-02-14 ENCOUNTER — Other Ambulatory Visit: Payer: Self-pay

## 2019-02-14 LAB — NOVEL CORONAVIRUS, NAA: SARS-CoV-2, NAA: NOT DETECTED

## 2019-02-15 ENCOUNTER — Encounter: Payer: Self-pay | Admitting: Family Medicine

## 2019-02-15 ENCOUNTER — Ambulatory Visit: Payer: BC Managed Care – PPO | Admitting: Family Medicine

## 2019-02-15 VITALS — BP 100/70 | HR 76 | Temp 97.7°F | Ht 64.0 in | Wt 147.4 lb

## 2019-02-15 DIAGNOSIS — Z91018 Allergy to other foods: Secondary | ICD-10-CM

## 2019-02-15 DIAGNOSIS — R1031 Right lower quadrant pain: Secondary | ICD-10-CM

## 2019-02-15 LAB — CBC WITH DIFFERENTIAL/PLATELET
Basophils Absolute: 0 10*3/uL (ref 0.0–0.1)
Basophils Relative: 0.6 % (ref 0.0–3.0)
Eosinophils Absolute: 0.1 10*3/uL (ref 0.0–0.7)
Eosinophils Relative: 1 % (ref 0.0–5.0)
HCT: 43.4 % (ref 36.0–46.0)
Hemoglobin: 14.5 g/dL (ref 12.0–15.0)
Lymphocytes Relative: 28.7 % (ref 12.0–46.0)
Lymphs Abs: 1.8 10*3/uL (ref 0.7–4.0)
MCHC: 33.4 g/dL (ref 30.0–36.0)
MCV: 91.5 fl (ref 78.0–100.0)
Monocytes Absolute: 0.4 10*3/uL (ref 0.1–1.0)
Monocytes Relative: 6.8 % (ref 3.0–12.0)
Neutro Abs: 3.9 10*3/uL (ref 1.4–7.7)
Neutrophils Relative %: 62.9 % (ref 43.0–77.0)
Platelets: 244 10*3/uL (ref 150.0–400.0)
RBC: 4.74 Mil/uL (ref 3.87–5.11)
RDW: 13.4 % (ref 11.5–15.5)
WBC: 6.1 10*3/uL (ref 4.0–10.5)

## 2019-02-15 LAB — COMPREHENSIVE METABOLIC PANEL
ALT: 27 U/L (ref 0–35)
AST: 18 U/L (ref 0–37)
Albumin: 4.4 g/dL (ref 3.5–5.2)
Alkaline Phosphatase: 113 U/L (ref 39–117)
BUN: 17 mg/dL (ref 6–23)
CO2: 29 mEq/L (ref 19–32)
Calcium: 9.9 mg/dL (ref 8.4–10.5)
Chloride: 102 mEq/L (ref 96–112)
Creatinine, Ser: 0.78 mg/dL (ref 0.40–1.20)
GFR: 75.62 mL/min (ref >60.00)
Glucose, Bld: 144 mg/dL — ABNORMAL HIGH (ref 70–99)
Potassium: 4.1 mEq/L (ref 3.5–5.1)
Sodium: 139 mEq/L (ref 135–145)
Total Bilirubin: 0.5 mg/dL (ref 0.2–1.2)
Total Protein: 6.9 g/dL (ref 6.0–8.3)

## 2019-02-15 NOTE — Progress Notes (Signed)
Jamie Avila [place] DOB: [birth year] Encounter date: 02/15/2019  This is a 59 y.o. female who presents with Chief Complaint  Patient presents with  . Abdominal Pain    patient complains of intermittent RLQ pelvic pain since June 2020    History of present illness: Hoping that this isn't a major problem, but getting tired of worrying about it. Started before June; started probiotics in June. Wasn't bothering her a lot in summer, but started getting worse again in fall. Cross between sharp gas pain, sometimes feels like ovarian pain (although she is post menopausal) and lower and more dull; sometimes up a little higher and more sharp pain. October was not good. Had son who got married in October and felt like she was having appendicitis night before wedding.   Then mother in law passed from pancreatic cancer.   Started taking probiotics again and sensation has really seemed to subside somewhat.   At night tends to be more bothersome; not sure if just noting more or positional.   No constipation, no diarrhea, no weight loss, no nausea, no vomiting, no blood in stools.   Had colonoscopy at age 65; was clear then.  Yesterday morning had a few sharp pains in morning, but then didn't bother her rest of day. Not stopped activity except for that one incidence.  Has had new food reaction this year where she is waking with swollen lip. Different parts of lip. Occasionally tongue has swollen - one time in front of tongue; once on side. Was convinced it was tomatoes or peanuts, but took those out of diet and since has tried peanuts without reaction. Also worse in October.   States that discomfort is not necessarily bowel movement related, but if she is very uncomfortable, she does seem to have some relief after having a bowel movement.  Allergies  Allergen Reactions  . Penicillins Rash   Current Meds  Medication Sig  . CALCIUM PO Take 600 mg by mouth daily.  Marland Kitchen EPINEPHrine 0.3 mg/0.3 mL IJ  SOAJ injection Inject 0.3 mLs (0.3 mg total) into the muscle as needed for anaphylaxis.  . Omega-3 Fatty Acids (FISH OIL PO) Take by mouth.  . Probiotic Product (PROBIOTIC PO) Take by mouth.  Marland Kitchen VITAMIN D PO Take by mouth daily.  . [DISCONTINUED] HYDROcodone-acetaminophen (NORCO/VICODIN) 5-325 MG tablet Take 1 tablet by mouth every 6 (six) hours as needed for moderate pain.    Review of Systems  Constitutional: Negative for chills, fatigue and fever.  Respiratory: Negative for cough, chest tightness, shortness of breath and wheezing.   Cardiovascular: Negative for chest pain, palpitations and leg swelling.    Objective:  BP 100/70   Pulse 76   Temp 97.7 F (36.5 C) (Temporal)   Ht 5\' 4"  (1.626 m)   Wt 147 lb 6.4 oz (66.9 kg)   SpO2 99%   BMI 25.30 kg/m   Weight: 147 lb 6.4 oz (66.9 kg)   BP Readings from Last 3 Encounters:  02/16/19 100/70  08/17/18 127/74  12/01/17 110/80   Wt Readings from Last 3 Encounters:  02/16/19 147 lb 6.4 oz (66.9 kg)  08/10/18 130 lb 1.1 oz (59 kg)  12/01/17 148 lb 12.8 oz (67.5 kg)    Physical Exam Constitutional:      General: She is not in acute distress.    Appearance: She is well-developed.  Cardiovascular:     Rate and Rhythm: Normal rate and regular rhythm.     Heart sounds: Normal heart sounds.  No murmur. No friction rub.  Pulmonary:     Effort: Pulmonary effort is normal. No respiratory distress.     Breath sounds: Normal breath sounds. No wheezing or rales.  Abdominal:     General: Bowel sounds are normal.     Tenderness: There is abdominal tenderness (slight mid-right tenderness to palpation and vague fullness). There is rebound (slight discomfort with rebound). There is no right CVA tenderness, left CVA tenderness or guarding. Negative signs include Murphy's sign and McBurney's sign.  Genitourinary:    Exam position: Lithotomy position.     Rectum: Normal. Guaiac result negative. No mass, tenderness, anal fissure, external  hemorrhoid or internal hemorrhoid. Normal anal tone.  Musculoskeletal:     Right lower leg: No edema.     Left lower leg: No edema.  Neurological:     Mental Status: She is alert and oriented to person, place, and time.  Psychiatric:        Behavior: Behavior normal.     Assessment/Plan  1. Right lower quadrant abdominal pain I am concerned with the longevity and persistence of discomfort and feel that we should evaluate with some imaging at this point. She will be due for colonoscopy in about a year, and we could consider sooner, but I feel that imaging prior to this to make sure there is no ongoing inflammation noted is reasonable. - Comprehensive metabolic panel; Future - CBC with Differential/Platelet; Future - CT ABDOMEN PELVIS W CONTRAST; Future - CBC with Differential/Platelet - Comprehensive metabolic panel  2. Food allergy Newly developed food allergy within the last year. She does have an appointment with allergy next week, but since trying blood today going to add an alpha gal panel since she seems to have a delayed allergy response. - Alpha-Gal Panel; Future - Alpha-Gal Panel   Return for pending labs, imaging.    Micheline Rough, MD

## 2019-02-16 ENCOUNTER — Encounter: Payer: Self-pay | Admitting: Family Medicine

## 2019-02-20 ENCOUNTER — Other Ambulatory Visit: Payer: Self-pay

## 2019-02-20 ENCOUNTER — Ambulatory Visit: Payer: BC Managed Care – PPO | Admitting: Allergy

## 2019-02-20 ENCOUNTER — Encounter: Payer: Self-pay | Admitting: Allergy

## 2019-02-20 VITALS — BP 110/80 | HR 72 | Temp 98.0°F | Resp 18 | Ht 64.0 in | Wt 148.4 lb

## 2019-02-20 DIAGNOSIS — T783XXA Angioneurotic edema, initial encounter: Secondary | ICD-10-CM | POA: Insufficient documentation

## 2019-02-20 DIAGNOSIS — R1031 Right lower quadrant pain: Secondary | ICD-10-CM | POA: Diagnosis not present

## 2019-02-20 DIAGNOSIS — T783XXD Angioneurotic edema, subsequent encounter: Secondary | ICD-10-CM | POA: Diagnosis not present

## 2019-02-20 LAB — ALPHA-GAL PANEL
Beef IgE: 0.17 kU/L (ref <0.35)
Class: 0
Class: 0
Galactose-alpha-1,3-galactose IgE: <0.1 kU/L (ref <0.10)
LAMB/MUTTON IGE: <0.1 kU/L (ref <0.35)
Pork IgE: <0.1 kU/L (ref <0.35)

## 2019-02-20 MED ORDER — FAMOTIDINE 20 MG PO TABS: 20.0000 mg | ORAL_TABLET | Freq: Two times a day (BID) | ORAL | 5 refills | Status: DC

## 2019-02-20 NOTE — Assessment & Plan Note (Signed)
   See assessment and plan as above.  She is scheduled to get CT abdomen.

## 2019-02-20 NOTE — Progress Notes (Signed)
New Patient Note  RE: Jamie Avila MRN: [id number] DOB: [birth year] Date of Office Visit: 02/20/2019  Referring provider: Caren Macadam, MD Primary care provider: Caren Macadam, MD  Chief Complaint: Allergic Reaction (Lip swelling, Tongue )  History of Present Illness: I had the pleasure of seeing Jamie Avila for initial evaluation at the [facility] on 02/20/2019. She is a 59 y.o. female, who is referred here by Caren Macadam, MD for the evaluation of allergic reaction.  Starting in the spring 2020 patient would wake up in the mornings with part of her lips swollen.  She would take benadryl and by noon the symptoms resolved.   This has happened about 20 times and occurs about every 2 weeks. Last episode was about 1 week ago.  She also had 3 episodes of tongue angioedema.  No other symptoms - denies any pruritus, rash, respiratory issues. She does get some right sided abdominal pains sometimes which started in spring. This seems to occur almost on a daily basis though.  In October she had more intense abdominal pains and lip swelling episodes but that's when her son was getting married.   Initially she thought it was triggered by peanuts or tomatoes. She had eliminated these foods but then reintroduced them with no issues.   Suspected triggers are unknown. Denies any fevers, chills, changes in medications, foods, personal care products or recent infections. She has tried the following therapies: benadryl with some benefit. Systemic steroids no. Currently on no medications.  Previous work up includes: 02/15/2019 was normal for CMP, CBC diff.  Previous history of rash/hives: 35 years ago she had hives which resolved on its own. Patient is up to date with the following cancer screening tests: mammogram, pap smears, colonoscopy.   Dietary History: patient has been eating other foods including milk, eggs, peanut, treenuts, sesame, shellfish,  seafood, soy, wheat, meats, fruits and vegetables.  Assessment and Plan: Jamie Avila is a 59 y.o. female with: Angio-edema Lip and tongue angioedema starting in March 2020. Occurs every 2 weeks with no specific triggers noted. Takes benadryl prn with some benefit. Possibly associated symptom is abdominal pain but that has been constant daily. Denies any changes in diet, meds, personal care products or recent infections. She had unknown hives about 35 years ago which resolved spontaneously. . Angioedema may be isolated or associated with urticaria. It may be histamine-induced or bradykinin-mediated. Patients with histamine-induced angioedema are more likely to have associated symptoms of urticaria, pruritus or signs of anaphylaxis. Patients with bradykinin-mediated do not have any symptoms of pruritus or urticaria and they did not improve with Benadryl or Epinephrine.  . Given her clinical history, it is difficult to say what or if there's a trigger for hives angioedema.  . Get bloodwork as below.   Start Zyrtec (cetirizine) 10mg  twice a day. If it makes you too drowsy let us know.   Start Pepcid 20mg  twice a day.  Monitor symptoms.   Take pictures of swelling.   Avoid NSAIDs and ACE-inhibitors as they lower the threshold for angioedema.   For mild symptoms you can take over the counter antihistamines such as Benadryl and monitor symptoms closely. If symptoms worsen or if you have severe symptoms including breathing issues, throat closure, significant swelling, whole body hives, severe diarrhea and vomiting, lightheadedness then inject epinephrine and seek immediate medical care afterwards.  Right lower quadrant abdominal pain  See assessment and plan as above.  She is scheduled to  get CT abdomen.   Return in about 4 weeks (around 03/20/2019).  Meds ordered this encounter  Medications  . famotidine (PEPCID) 20 MG tablet    Sig: Take 1 tablet (20 mg total) by mouth 2 (two) times daily.     Dispense:  60 tablet    Refill:  5    Lab Orders     C3 and C4     C1 esterase inhibitor, functional     C1 Esterase Inhibitor     Complement component c1q     Tryptase     Protein electrophoresis, serum     Protein Electrophoresis, Urine Rflx.     Thyroid Cascade Profile     ANA w/Reflex  Other allergy screening: Asthma: no Rhino conjunctivitis: no Medication allergy: yes  Penicillin - rash as a child.  Hymenoptera allergy: no Eczema:no History of recurrent infections suggestive of immunodeficency: no  Diagnostics: None.  Past Medical History: Patient Active Problem List   Diagnosis Date Noted  . Angio-edema 02/20/2019  . Right lower quadrant abdominal pain 02/20/2019  . Sacroiliac joint pain 09/22/2011  . Peroneal tendinitis, left 05/26/2011  . Shoulder joint dislocation 10/16/2010  . Right knee pain 09/04/2010  . VITAMIN D DEFICIENCY 06/03/2009  . Hyperlipidemia 06/03/2009   Past Medical History:  Diagnosis Date  . GERD (gastroesophageal reflux disease)   . Left breast mass   . Ovarian cyst 1982  . Proximal humerus fracture 2016   Past Surgical History: Past Surgical History:  Procedure Laterality Date  . BREAST LUMPECTOMY WITH RADIOACTIVE SEED LOCALIZATION Left 08/17/2018   Procedure: LEFT BREAST LUMPECTOMY WITH RADIOACTIVE SEED LOCALIZATION;  Surgeon: Donnie Mesa, MD;  Location: [facility];  Service: General;  Laterality: Left;  . COLONOSCOPY  2012   repeat 2022  . HUMERUS FRACTURE SURGERY  2016  . HUMERUS FRACTURE SURGERY Right   . OVARIAN CYST REMOVAL     Medication List:  Current Outpatient Medications  Medication Sig Dispense Refill  . CALCIUM PO Take 600 mg by mouth daily.    Marland Kitchen EPINEPHrine 0.3 mg/0.3 mL IJ SOAJ injection Inject 0.3 mLs (0.3 mg total) into the muscle as needed for anaphylaxis. 1 each 0  . Omega-3 Fatty Acids (FISH OIL PO) Take by mouth.    . Probiotic Product (PROBIOTIC PO) Take by mouth.    Marland Kitchen VITAMIN D PO Take  by mouth daily.    . famotidine (PEPCID) 20 MG tablet Take 1 tablet (20 mg total) by mouth 2 (two) times daily. 60 tablet 5   No current facility-administered medications for this visit.   Allergies: Allergies  Allergen Reactions  . Penicillins Rash   Social History: Social History   Socioeconomic History  . Marital status: Married    Spouse name: Not on file  . Number of children: Not on file  . Years of education: Not on file  . Highest education level: Not on file  Occupational History  . Not on file  Tobacco Use  . Smoking status: Never Smoker  . Smokeless tobacco: Never Used  Substance and Sexual Activity  . Alcohol use: Yes    Comment: social  . Drug use: Never  . Sexual activity: Not on file  Other Topics Concern  . Not on file  Social History Narrative  . Not on file   Social Determinants of Health   Financial Resource Strain:   . Difficulty of Paying Living Expenses: Not on file  Food Insecurity:   . Worried  About Running Out of Food in the Last Year: Not on file  . Ran Out of Food in the Last Year: Not on file  Transportation Needs:   . Lack of Transportation (Medical): Not on file  . Lack of Transportation (Non-Medical): Not on file  Physical Activity:   . Days of Exercise per Week: Not on file  . Minutes of Exercise per Session: Not on file  Stress:   . Feeling of Stress : Not on file  Social Connections:   . Frequency of Communication with Friends and Family: Not on file  . Frequency of Social Gatherings with Friends and Family: Not on file  . Attends Religious Services: Not on file  . Active Member of Clubs or Organizations: Not on file  . Attends Archivist Meetings: Not on file  . Marital Status: Not on file   Lives in a 59 year old home. Smoking: denies Occupation: Youth worker HistoryFreight forwarder in the house: no Charity fundraiser in the family room: no Carpet in the bedroom: yes Heating: gas Cooling: central Pet:  no  Family History: Family History  Problem Relation Age of Onset  . High Cholesterol Mother   . Osteoporosis Mother   . COPD Mother   . Arthritis Mother        rheumatoid in hands; mild  . Hearing loss Mother   . High Cholesterol Father   . Arthritis Father        osteo  . Osteoporosis Father   . CAD Father   . Heart disease Maternal Grandmother        CHF; worsened after hip fracture  . Early death Maternal Grandfather        uncertain cause  . Heart attack Paternal Grandmother 78  . Heart disease Paternal Grandmother   . Early death Paternal Grandfather 49       ?valve issue  . Heart attack Paternal Grandfather   . Heart disease Paternal Grandfather   . Breast cancer Neg Hx    Problem                               Relation Asthma                                   No  Eczema                                No  Food allergy                          Maternal grandmother Allergic rhino conjunctivitis     No  Angioedema    No  Review of Systems  Constitutional: Negative for appetite change, chills, fever and unexpected weight change.  HENT: Negative for congestion and rhinorrhea.   Eyes: Negative for itching.  Respiratory: Negative for cough, chest tightness, shortness of breath and wheezing.   Cardiovascular: Negative for chest pain.  Gastrointestinal: Positive for abdominal pain. Negative for constipation, diarrhea, nausea and vomiting.  Genitourinary: Negative for difficulty urinating.  Skin: Negative for rash.  Neurological: Negative for headaches.   Objective: BP 110/80   Pulse 72   Temp 98 F (36.7 C) (Temporal)   Resp 18   Ht 5\' 4"  (1.626 m)  Wt 148 lb 6.4 oz (67.3 kg)   SpO2 98%   BMI 25.47 kg/m  Body mass index is 25.47 kg/m. Physical Exam  Constitutional: She is oriented to person, place, and time. She appears well-developed and well-nourished.  HENT:  Head: Normocephalic and atraumatic.  Right Ear: External ear normal.  Left Ear: External ear  normal.  Nose: Nose normal.  Mouth/Throat: Oropharynx is clear and moist.  Eyes: Conjunctivae and EOM are normal.  Cardiovascular: Normal rate, regular rhythm and normal heart sounds. Exam reveals no gallop and no friction rub.  No murmur heard. Pulmonary/Chest: Effort normal and breath sounds normal. She has no wheezes. She has no rales.  Abdominal: Soft.  Musculoskeletal:     Cervical back: Neck supple.  Neurological: She is alert and oriented to person, place, and time.  Skin: Skin is warm. No rash noted.  Psychiatric: She has a normal mood and affect. Her behavior is normal.  Nursing note and vitals reviewed.  The plan was reviewed with the patient/family, and all questions/concerned were addressed.  It was my pleasure to see Jamie Avila today and participate in her care. Please feel free to contact me with any questions or concerns.  Sincerely,  Rexene Alberts, DO Allergy & Immunology  [facility] office: [phone] [facility] office: [place] office: [phone]

## 2019-02-20 NOTE — Assessment & Plan Note (Addendum)
Lip and tongue angioedema starting in March 2020. Occurs every 2 weeks with no specific triggers noted. Takes benadryl prn with some benefit. Possibly associated symptom is abdominal pain but that has been constant daily. Denies any changes in diet, meds, personal care products or recent infections. She had unknown hives about 35 years ago which resolved spontaneously. . Angioedema may be isolated or associated with urticaria. It may be histamine-induced or bradykinin-mediated. Patients with histamine-induced angioedema are more likely to have associated symptoms of urticaria, pruritus or signs of anaphylaxis. Patients with bradykinin-mediated do not have any symptoms of pruritus or urticaria and they did not improve with Benadryl or Epinephrine.  . Given her clinical history, it is difficult to say what or if there's a trigger for hives angioedema.  . Get bloodwork as below.   Start Zyrtec (cetirizine) 10mg  twice a day. If it makes you too drowsy let us know.   Start Pepcid 20mg  twice a day.  Monitor symptoms.   Take pictures of swelling.   Avoid NSAIDs and ACE-inhibitors as they lower the threshold for angioedema.   For mild symptoms you can take over the counter antihistamines such as Benadryl and monitor symptoms closely. If symptoms worsen or if you have severe symptoms including breathing issues, throat closure, significant swelling, whole body hives, severe diarrhea and vomiting, lightheadedness then inject epinephrine and seek immediate medical care afterwards.

## 2019-02-20 NOTE — Patient Instructions (Addendum)
Start Zyrtec (cetirizine) 10mg  twice a day.  If it makes you too drowsy let us know.  Start Pepcid 20mg  twice a day.  Get CT abdomen as scheduled. Monitor symptoms.  Take pictures of swelling.   Avoid NSAIDs and ACE-inhibitors as they lower the threshold for angioedema.   For mild symptoms you can take over the counter antihistamines such as Benadryl and monitor symptoms closely. If symptoms worsen or if you have severe symptoms including breathing issues, throat closure, significant swelling, whole body hives, severe diarrhea and vomiting, lightheadedness then inject epinephrine and seek immediate medical care afterwards.   . Get bloodwork:  o We are ordering labs, so please allow 1-2 weeks for the results to come back. o With the newly implemented Cures Act, the labs might be visible to you at the same time that they become visible to me. However, I will not address the results until all of the results are back, so please be patient.   Follow up in 4 weeks or sooner if needed.

## 2019-02-28 LAB — PROTEIN ELECTROPHORESIS, SERUM
A/G Ratio: 1.1 (ref 0.7–1.7)
Albumin ELP: 3.8 g/dL (ref 2.9–4.4)
Alpha 1: 0.2 g/dL (ref 0.0–0.4)
Alpha 2: 0.9 g/dL (ref 0.4–1.0)
Beta: 1.5 g/dL — ABNORMAL HIGH (ref 0.7–1.3)
Gamma Globulin: 1 g/dL (ref 0.4–1.8)
Globulin, Total: 3.6 g/dL (ref 2.2–3.9)
Total Protein: 7.4 g/dL (ref 6.0–8.5)

## 2019-02-28 LAB — PROTEIN ELECTROPHORESIS, URINE REFLEX
Albumin ELP, Urine: 100 %
Alpha-1-Globulin, U: 0 %
Alpha-2-Globulin, U: 0 %
Beta Globulin, U: 0 %
Gamma Globulin, U: 0 %
Protein, Ur: 4.3 mg/dL

## 2019-02-28 LAB — C3 AND C4
Complement C3, Serum: 163 mg/dL (ref 82–167)
Complement C4, Serum: 29 mg/dL (ref 12–38)

## 2019-02-28 LAB — C1 ESTERASE INHIBITOR: C1INH SerPl-mCnc: 40 mg/dL — ABNORMAL HIGH (ref 21–39)

## 2019-02-28 LAB — ANA W/REFLEX: Anti Nuclear Antibody (ANA): NEGATIVE

## 2019-02-28 LAB — TRYPTASE: Tryptase: 4.7 ug/L (ref 2.2–13.2)

## 2019-02-28 LAB — THYROID CASCADE PROFILE: TSH: 1.51 u[IU]/mL (ref 0.450–4.500)

## 2019-02-28 LAB — C1 ESTERASE INHIBITOR, FUNCTIONAL: C1INH Functional/C1INH Total MFr SerPl: >100 %mean normal

## 2019-02-28 LAB — COMPLEMENT COMPONENT C1Q: Complement C1Q: 15.6 mg/dL (ref 10.3–20.5)

## 2019-03-01 ENCOUNTER — Telehealth: Payer: Self-pay | Admitting: Allergy

## 2019-03-01 NOTE — Telephone Encounter (Signed)
Patient called and would like for you to call her about the blood work she had done. For the lip swelling and the stomach pain, she would like to know if it could be lactose intolerance? [phone]

## 2019-03-02 NOTE — Telephone Encounter (Signed)
Please call back patient.  The lip swelling is not due to lactose intolerance. Lactose intolerance means that her GI tract can't digest the lactose protein in the milk. This usually causes abdominal pain, increased gas and diarrhea.  As of now, there is no trigger for the lip swelling that we could find.  I still recommend that you do the following as this will tell me if her episodes are due to a histamine release or not.  Keep track of episodes.   Start Zyrtec (cetirizine) 10mg  twice a day. If it makes you too drowsy let us know.   Start Pepcid 20mg  twice a day.  Take pictures of swelling.   Avoid NSAIDs and ACE-inhibitors as they lower the threshold for angioedema.

## 2019-03-02 NOTE — Telephone Encounter (Signed)
Left message regarding her reactions and will leave a Mychart message regarding Dr. Julianne Rice recommendation.

## 2019-03-03 ENCOUNTER — Ambulatory Visit
Admission: RE | Admit: 2019-03-03 | Discharge: 2019-03-03 | Disposition: A | Discharge status: Home / Self Care | Payer: BC Managed Care – PPO | Source: Ambulatory Visit | Attending: Family Medicine | Admitting: Family Medicine

## 2019-03-03 DIAGNOSIS — R1031 Right lower quadrant pain: Secondary | ICD-10-CM

## 2019-03-03 MED ORDER — IOPAMIDOL (ISOVUE-300) INJECTION 61%
100.0000 mL | Freq: Once | INTRAVENOUS | Status: AC | PRN
  Administered 2019-03-03: 11:00:00 100 mL via INTRAVENOUS

## 2019-03-06 ENCOUNTER — Encounter: Payer: Self-pay | Admitting: Family Medicine

## 2019-03-06 DIAGNOSIS — N83201 Unspecified ovarian cyst, right side: Secondary | ICD-10-CM

## 2019-03-06 DIAGNOSIS — H5310 Unspecified subjective visual disturbances: Secondary | ICD-10-CM | POA: Diagnosis not present

## 2019-03-08 NOTE — Progress Notes (Signed)
Referral placed and patient notified.  

## 2019-03-14 DIAGNOSIS — N83201 Unspecified ovarian cyst, right side: Secondary | ICD-10-CM | POA: Diagnosis not present

## 2019-03-14 DIAGNOSIS — D259 Leiomyoma of uterus, unspecified: Secondary | ICD-10-CM | POA: Diagnosis not present

## 2019-03-15 ENCOUNTER — Other Ambulatory Visit: Payer: BC Managed Care – PPO

## 2019-03-16 ENCOUNTER — Other Ambulatory Visit: Payer: BC Managed Care – PPO

## 2019-03-16 ENCOUNTER — Ambulatory Visit: Payer: BC Managed Care – PPO | Attending: Internal Medicine

## 2019-03-16 DIAGNOSIS — N840 Polyp of corpus uteri: Secondary | ICD-10-CM | POA: Diagnosis not present

## 2019-03-16 DIAGNOSIS — Z20822 Contact with and (suspected) exposure to covid-19: Secondary | ICD-10-CM | POA: Diagnosis not present

## 2019-03-17 LAB — NOVEL CORONAVIRUS, NAA: SARS-CoV-2, NAA: NOT DETECTED

## 2019-03-28 ENCOUNTER — Telehealth: Payer: Self-pay | Admitting: Hematology and Oncology

## 2019-03-28 NOTE — Telephone Encounter (Signed)
Received a new pt referral from Dr. Radene Knee for Jamie Avila to be seen in the high risk breast clinic. She returned my call and has been scheduled to see Jamie Avila on 3/25 at 1pm. She's aware to arrive 15 minutes early.

## 2019-04-03 ENCOUNTER — Ambulatory Visit: Payer: BC Managed Care – PPO | Admitting: Allergy

## 2019-04-05 DIAGNOSIS — Z01419 Encounter for gynecological examination (general) (routine) without abnormal findings: Secondary | ICD-10-CM | POA: Diagnosis not present

## 2019-04-05 DIAGNOSIS — Z6825 Body mass index (BMI) 25.0-25.9, adult: Secondary | ICD-10-CM | POA: Diagnosis not present

## 2019-04-05 DIAGNOSIS — Z1231 Encounter for screening mammogram for malignant neoplasm of breast: Secondary | ICD-10-CM | POA: Diagnosis not present

## 2019-04-12 DIAGNOSIS — N840 Polyp of corpus uteri: Secondary | ICD-10-CM | POA: Diagnosis not present

## 2019-04-12 DIAGNOSIS — N858 Other specified noninflammatory disorders of uterus: Secondary | ICD-10-CM | POA: Diagnosis not present

## 2019-04-12 NOTE — Progress Notes (Signed)
[facility] NOTE  Patient Care Team: Caren Macadam, MD as PCP - General (Family Medicine)  CHIEF COMPLAINTS/PURPOSE OF CONSULTATION:  Newly diagnosed high risk for breast cancer  HISTORY OF PRESENTING ILLNESS:  Jamie Avila 59 y.o. female is here because of recent diagnosis of high risk for breast cancer. In 07/2018 she noted right breast thickening. Diagnostic mammogram showed normal right breast and a 1.4cm mass in the left breast. Biopsy showed intraductal papilloma. She underwent a left lumpectomy on 08/17/18 with Dr. Georgette Dover for which pathology showed intraductal papillomas, atypical ductal hyperplasia, and no evidence of malignancy. She presents to the clinic today for initial evaluation and discussion of surveillance options.   I reviewed her records extensively and collaborated the history with the patient.  SUMMARY OF ONCOLOGIC HISTORY: Oncology History   No history exists.    MEDICAL HISTORY:  Past Medical History:  Diagnosis Date  . GERD (gastroesophageal reflux disease)   . Left breast mass   . Ovarian cyst 1982  . Proximal humerus fracture 2016    SURGICAL HISTORY: Past Surgical History:  Procedure Laterality Date  . BREAST LUMPECTOMY WITH RADIOACTIVE SEED LOCALIZATION Left 08/17/2018   Procedure: LEFT BREAST LUMPECTOMY WITH RADIOACTIVE SEED LOCALIZATION;  Surgeon: Donnie Mesa, MD;  Location: [facility];  Service: General;  Laterality: Left;  . COLONOSCOPY  2012   repeat 2022  . HUMERUS FRACTURE SURGERY  2016  . HUMERUS FRACTURE SURGERY Right   . OVARIAN CYST REMOVAL      SOCIAL HISTORY: Social History   Socioeconomic History  . Marital status: Married    Spouse name: Not on file  . Number of children: Not on file  . Years of education: Not on file  . Highest education level: Not on file  Occupational History  . Not on file  Tobacco Use  . Smoking status: Never Smoker  . Smokeless tobacco: Never Used   Substance and Sexual Activity  . Alcohol use: Yes    Comment: social  . Drug use: Never  . Sexual activity: Not on file  Other Topics Concern  . Not on file  Social History Narrative  . Not on file   Social Determinants of Health   Financial Resource Strain:   . Difficulty of Paying Living Expenses:   Food Insecurity:   . Worried About Charity fundraiser in the Last Year:   . Arboriculturist in the Last Year:   Transportation Needs:   . Film/video editor (Medical):   Marland Kitchen Lack of Transportation (Non-Medical):   Physical Activity:   . Days of Exercise per Week:   . Minutes of Exercise per Session:   Stress:   . Feeling of Stress :   Social Connections:   . Frequency of Communication with Friends and Family:   . Frequency of Social Gatherings with Friends and Family:   . Attends Religious Services:   . Active Member of Clubs or Organizations:   . Attends Archivist Meetings:   Marland Kitchen Marital Status:   Intimate Partner Violence:   . Fear of Current or Ex-Partner:   . Emotionally Abused:   Marland Kitchen Physically Abused:   . Sexually Abused:     FAMILY HISTORY: Family History  Problem Relation Age of Onset  . High Cholesterol Mother   . Osteoporosis Mother   . COPD Mother   . Arthritis Mother        rheumatoid in hands; mild  .  Hearing loss Mother   . High Cholesterol Father   . Arthritis Father        osteo  . Osteoporosis Father   . CAD Father   . Heart disease Maternal Grandmother        CHF; worsened after hip fracture  . Early death Maternal Grandfather        uncertain cause  . Heart attack Paternal Grandmother 78  . Heart disease Paternal Grandmother   . Early death Paternal Grandfather 72       ?valve issue  . Heart attack Paternal Grandfather   . Heart disease Paternal Grandfather   . Breast cancer Neg Hx     ALLERGIES:  is allergic to penicillins.  MEDICATIONS:  Current Outpatient Medications  Medication Sig Dispense Refill  . CALCIUM PO  Take 600 mg by mouth daily.    Marland Kitchen EPINEPHrine 0.3 mg/0.3 mL IJ SOAJ injection Inject 0.3 mLs (0.3 mg total) into the muscle as needed for anaphylaxis. 1 each 0  . famotidine (PEPCID) 20 MG tablet Take 1 tablet (20 mg total) by mouth 2 (two) times daily. 60 tablet 5  . Omega-3 Fatty Acids (FISH OIL PO) Take by mouth.    . Probiotic Product (PROBIOTIC PO) Take by mouth.    . tamoxifen (NOLVADEX) 20 MG tablet Take 1 tablet (20 mg total) by mouth daily. 90 tablet 3  . VITAMIN D PO Take by mouth daily.     No current facility-administered medications for this visit.    REVIEW OF SYSTEMS:   Constitutional: Denies fevers, chills or abnormal night sweats Eyes: Denies blurriness of vision, double vision or watery eyes Ears, nose, mouth, throat, and face: Denies mucositis or sore throat Respiratory: Denies cough, dyspnea or wheezes Cardiovascular: Denies palpitation, chest discomfort or lower extremity swelling Gastrointestinal:  Denies nausea, heartburn or change in bowel habits Skin: Denies abnormal skin rashes Lymphatics: Denies new lymphadenopathy or easy bruising Neurological:Denies numbness, tingling or new weaknesses Behavioral/Psych: Mood is stable, no new changes  Breast: Denies any palpable lumps or discharge All other systems were reviewed with the patient and are negative.  PHYSICAL EXAMINATION: ECOG PERFORMANCE STATUS: 1 - Symptomatic but completely ambulatory  Vitals:   04/13/19 1259  BP: 121/66  Pulse: 68  Resp: 17  Temp: 98.2 F (36.8 C)  SpO2: 100%   Filed Weights   04/13/19 1259  Weight: 149 lb 11.2 oz (67.9 kg)    GENERAL:alert, no distress and comfortable SKIN: skin color, texture, turgor are normal, no rashes or significant lesions EYES: normal, conjunctiva are pink and non-injected, sclera clear OROPHARYNX:no exudate, no erythema and lips, buccal mucosa, and tongue normal  NECK: supple, thyroid normal size, non-tender, without nodularity LYMPH:  no palpable  lymphadenopathy in the cervical, axillary or inguinal LUNGS: clear to auscultation and percussion with normal breathing effort HEART: regular rate & rhythm and no murmurs and no lower extremity edema ABDOMEN:abdomen soft, non-tender and normal bowel sounds Musculoskeletal:no cyanosis of digits and no clubbing  PSYCH: alert & oriented x 3 with fluent speech NEURO: no focal motor/sensory deficits  LABORATORY DATA:  I have reviewed the data as listed Lab Results  Component Value Date   WBC 6.1 02/15/2019   HGB 14.5 02/15/2019   HCT 43.4 02/15/2019   MCV 91.5 02/15/2019   PLT 244.0 02/15/2019   Lab Results  Component Value Date   NA 139 02/15/2019   K 4.1 02/15/2019   CL 102 02/15/2019   CO2 29 02/15/2019  RADIOGRAPHIC STUDIES: I have personally reviewed the radiological reports and agreed with the findings in the report.  ASSESSMENT AND PLAN:  Intraductal papilloma of left breast left lumpectomy 08/17/18 with Dr. Georgette Dover for which pathology showed intraductal papillomas, atypical ductal hyperplasia, and no evidence of malignancy.  Since the patient was felt to be at high risk of breast cancer, she was referred to [place] for evaluation of her risk and formulating a surveillance plan.  Tyrer Cusick risk assessment: 28% lifetime risk NCI breast cancer risk assessment tool: 20% lifetime risk, 5-year risk: 3.7%  1. Patient and I discussed her pathology and radiology in detail. I went over the pathology with the patient in detail. We discussed the benign and malignant disease of the breast as well atypical lesions of the breast.   2. We discussed different risk reducing strategies for future breast cancer risk. We discussed chemoprevention and lifestyle modification. We discussed role of ongoing surveillance with mammograms versus MRIs. Patient would be a good candidate for chemoprevention with  tamoxifen or Evista or aromatase inhibitors. We discussed different medications available. We  discussed their particular side effects. With tamoxifen side effects would include but not limited to cataract axilla radiation, thrombosis, uterine malignancy, hot flashes mood swings.    3. Lifestyle modification: We discussed different interventions exercise at least 6 days a week including both aerobic as well as weight-bearing exercises and strength training. He discussed dietary modification including increasing number of servings of fruit and vegetables as well as decreased in number of servings of meat. We discussed the importance of maintaining a good BMI.  Discussed eliminating or limiting alcohol consumption.    4. Surveillance: Annual mammograms and annual breast MRIs because her lifetime risk is greater than 20%  5. Chemoprevention: Patient was given a prescription for tamoxifen. Risks benefits and side effects were discussed with her. Literature was given to her and reviewed with her. A prescription for tamoxifen 20 mg was sent to her pharmacy.  I instructed her to start at half a tablet daily for 2 weeks and then increase it to full dose if she tolerates it well.   6. Followup: Patient will be seen back in 3 months with a MyChart virtual visit.    All questions were answered. The patient knows to call the clinic with any problems, questions or concerns.   Rulon Eisenmenger, MD, MPH 04/13/2019    I, Molly Dorshimer, am acting as scribe for Nicholas Lose, MD.  I have reviewed the above documentation for accuracy and completeness, and I agree with the above.

## 2019-04-13 ENCOUNTER — Inpatient Hospital Stay: Payer: BC Managed Care – PPO | Attending: Hematology and Oncology | Admitting: Hematology and Oncology

## 2019-04-13 ENCOUNTER — Other Ambulatory Visit: Payer: Self-pay

## 2019-04-13 ENCOUNTER — Encounter: Payer: Self-pay | Admitting: Hematology and Oncology

## 2019-04-13 DIAGNOSIS — D242 Benign neoplasm of left breast: Secondary | ICD-10-CM | POA: Diagnosis not present

## 2019-04-13 MED ORDER — TAMOXIFEN CITRATE 20 MG PO TABS: 20.0000 mg | ORAL_TABLET | Freq: Every day | ORAL | 3 refills | Status: DC

## 2019-04-13 NOTE — Assessment & Plan Note (Addendum)
left lumpectomy 08/17/18 with Dr. Georgette Dover for which pathology showed intraductal papillomas, atypical ductal hyperplasia, and no evidence of malignancy.  Since the patient was felt to be at high risk of breast cancer, she was referred to [place] for evaluation of her risk and formulating a surveillance plan.  Tyrer Cusick risk assessment:  Recommendation:

## 2019-07-14 ENCOUNTER — Telehealth: Payer: BC Managed Care – PPO | Admitting: Hematology and Oncology

## 2019-08-03 ENCOUNTER — Encounter: Payer: Self-pay | Admitting: Family Medicine

## 2019-09-10 DIAGNOSIS — Z20822 Contact with and (suspected) exposure to covid-19: Secondary | ICD-10-CM | POA: Diagnosis not present

## 2019-09-10 DIAGNOSIS — Z03818 Encounter for observation for suspected exposure to other biological agents ruled out: Secondary | ICD-10-CM | POA: Diagnosis not present

## 2019-10-03 DIAGNOSIS — N83201 Unspecified ovarian cyst, right side: Secondary | ICD-10-CM | POA: Diagnosis not present

## 2019-10-05 ENCOUNTER — Other Ambulatory Visit: Payer: Self-pay | Admitting: Obstetrics and Gynecology

## 2019-10-05 DIAGNOSIS — Z9189 Other specified personal risk factors, not elsewhere classified: Secondary | ICD-10-CM

## 2019-10-26 DIAGNOSIS — Z85828 Personal history of other malignant neoplasm of skin: Secondary | ICD-10-CM | POA: Diagnosis not present

## 2019-10-26 DIAGNOSIS — Z411 Encounter for cosmetic surgery: Secondary | ICD-10-CM | POA: Diagnosis not present

## 2019-10-26 DIAGNOSIS — Z86018 Personal history of other benign neoplasm: Secondary | ICD-10-CM | POA: Diagnosis not present

## 2019-10-26 DIAGNOSIS — D225 Melanocytic nevi of trunk: Secondary | ICD-10-CM | POA: Diagnosis not present

## 2019-11-20 DIAGNOSIS — Z20822 Contact with and (suspected) exposure to covid-19: Secondary | ICD-10-CM | POA: Diagnosis not present

## 2019-11-21 DIAGNOSIS — Z20822 Contact with and (suspected) exposure to covid-19: Secondary | ICD-10-CM | POA: Diagnosis not present

## 2020-05-01 ENCOUNTER — Ambulatory Visit (INDEPENDENT_AMBULATORY_CARE_PROVIDER_SITE_OTHER): Payer: BC Managed Care – PPO

## 2020-05-01 ENCOUNTER — Other Ambulatory Visit: Payer: Self-pay

## 2020-05-01 ENCOUNTER — Encounter: Payer: Self-pay | Admitting: Family Medicine

## 2020-05-01 ENCOUNTER — Ambulatory Visit (INDEPENDENT_AMBULATORY_CARE_PROVIDER_SITE_OTHER): Payer: BC Managed Care – PPO | Admitting: Family Medicine

## 2020-05-01 VITALS — BP 100/72 | HR 68 | Temp 97.7°F | Ht 63.5 in | Wt 146.1 lb

## 2020-05-01 DIAGNOSIS — R1031 Right lower quadrant pain: Secondary | ICD-10-CM

## 2020-05-01 DIAGNOSIS — E785 Hyperlipidemia, unspecified: Secondary | ICD-10-CM | POA: Diagnosis not present

## 2020-05-01 DIAGNOSIS — Z1211 Encounter for screening for malignant neoplasm of colon: Secondary | ICD-10-CM | POA: Diagnosis not present

## 2020-05-01 DIAGNOSIS — E559 Vitamin D deficiency, unspecified: Secondary | ICD-10-CM

## 2020-05-01 DIAGNOSIS — Z0000 Encounter for general adult medical examination without abnormal findings: Secondary | ICD-10-CM

## 2020-05-01 DIAGNOSIS — M79642 Pain in left hand: Secondary | ICD-10-CM | POA: Diagnosis not present

## 2020-05-01 DIAGNOSIS — M2550 Pain in unspecified joint: Secondary | ICD-10-CM

## 2020-05-01 DIAGNOSIS — D242 Benign neoplasm of left breast: Secondary | ICD-10-CM

## 2020-05-01 LAB — CBC WITH DIFFERENTIAL/PLATELET
Basophils Absolute: 0.1 10*3/uL (ref 0.0–0.1)
Basophils Relative: 1.1 % (ref 0.0–3.0)
Eosinophils Absolute: 0.1 10*3/uL (ref 0.0–0.7)
Eosinophils Relative: 1.6 % (ref 0.0–5.0)
HCT: 43.3 % (ref 36.0–46.0)
Hemoglobin: 14.7 g/dL (ref 12.0–15.0)
Lymphocytes Relative: 31.2 % (ref 12.0–46.0)
Lymphs Abs: 1.8 10*3/uL (ref 0.7–4.0)
MCHC: 33.9 g/dL (ref 30.0–36.0)
MCV: 91.6 fl (ref 78.0–100.0)
Monocytes Absolute: 0.5 10*3/uL (ref 0.1–1.0)
Monocytes Relative: 9.4 % (ref 3.0–12.0)
Neutro Abs: 3.2 10*3/uL (ref 1.4–7.7)
Neutrophils Relative %: 56.7 % (ref 43.0–77.0)
Platelets: 216 10*3/uL (ref 150.0–400.0)
RBC: 4.73 Mil/uL (ref 3.87–5.11)
RDW: 13.1 % (ref 11.5–15.5)
WBC: 5.7 10*3/uL (ref 4.0–10.5)

## 2020-05-01 LAB — LIPID PANEL
Cholesterol: 251 mg/dL — ABNORMAL HIGH (ref 0–200)
HDL: 43 mg/dL (ref >39.00)
Total CHOL/HDL Ratio: 6
Triglycerides: 489 mg/dL — ABNORMAL HIGH (ref 0.0–149.0)

## 2020-05-01 LAB — LDL CHOLESTEROL, DIRECT: Direct LDL: 139 mg/dL

## 2020-05-01 LAB — COMPREHENSIVE METABOLIC PANEL
ALT: 16 U/L (ref 0–35)
AST: 14 U/L (ref 0–37)
Albumin: 4.2 g/dL (ref 3.5–5.2)
Alkaline Phosphatase: 108 U/L (ref 39–117)
BUN: 24 mg/dL — ABNORMAL HIGH (ref 6–23)
CO2: 27 mEq/L (ref 19–32)
Calcium: 9.6 mg/dL (ref 8.4–10.5)
Chloride: 102 mEq/L (ref 96–112)
Creatinine, Ser: 0.88 mg/dL (ref 0.40–1.20)
GFR: 71.59 mL/min (ref >60.00)
Glucose, Bld: 96 mg/dL (ref 70–99)
Potassium: 4.1 mEq/L (ref 3.5–5.1)
Sodium: 140 mEq/L (ref 135–145)
Total Bilirubin: 0.5 mg/dL (ref 0.2–1.2)
Total Protein: 7.2 g/dL (ref 6.0–8.3)

## 2020-05-01 LAB — VITAMIN D 25 HYDROXY (VIT D DEFICIENCY, FRACTURES): VITD: 20.48 ng/mL — ABNORMAL LOW (ref 30.00–100.00)

## 2020-05-01 LAB — SEDIMENTATION RATE: Sed Rate: 15 mm/hr (ref 0–30)

## 2020-05-01 NOTE — Progress Notes (Signed)
Jamie Avila DOB: 06/12/1960 Encounter date: 05/01/2020  This is a 60 y.o. female who presents for complete physical   History of present illness/Additional concerns: Following with Dr. Lindi Adie; started on tamoxifendue to "Intraductal papilloma of left breast left lumpectomy 08/17/18 with Dr. Georgette Dover for which pathology showed intraductal papillomas, atypical ductal hyperplasia, and no evidence of malignancy. 20+% lifetime breast cancer risk calculated"). She ended up deciding not to take tamoxifen given side effects/benefit profile. Has follow up with oncology soon. She would like to continue with routine imaging.   She saw gyn after seeing me - thought combination of low water intake and lack of fiber causing issues. She increased water intake but pain came back in fall. Sensation feels more like bowel issue - little bit of sharp pain if she feels it. Most of the time doesn't feel it. When exercising/running then feels it but different - like dull pain and after gets moving goes away. Not worse after she rests. She states it is almost nothing at all compared to last year.   Primary reason for her coming in here is because she is due to colonoscopy; prior doc is retiring.    Past Medical History:  Diagnosis Date  . GERD (gastroesophageal reflux disease)   . Left breast mass   . Ovarian cyst 1982  . Peroneal tendinitis, left 05/26/2011  . Proximal humerus fracture 2016  . Right knee pain 09/04/2010  . Sacroiliac joint pain 09/22/2011   Pain localizes directly over SIJ SIJ movement seemed OK   . Shoulder joint dislocation 10/16/2010   She had a posterior glenohumeral joint dislocation on the right and a partial a.c. joint dislocation on the left from a fall while hiking    Past Surgical History:  Procedure Laterality Date  . BREAST LUMPECTOMY WITH RADIOACTIVE SEED LOCALIZATION Left 08/17/2018   Procedure: LEFT BREAST LUMPECTOMY WITH RADIOACTIVE SEED LOCALIZATION;  Surgeon: Donnie Mesa,  MD;  Location: [facility];  Service: General;  Laterality: Left;  . COLONOSCOPY  2012   repeat 2022  . HUMERUS FRACTURE SURGERY  2016  . HUMERUS FRACTURE SURGERY Right   . OVARIAN CYST REMOVAL     Allergies  Allergen Reactions  . Penicillins Rash   Current Meds  Medication Sig  . CALCIUM PO Take 600 mg by mouth daily.  Marland Kitchen EPINEPHrine 0.3 mg/0.3 mL IJ SOAJ injection Inject 0.3 mLs (0.3 mg total) into the muscle as needed for anaphylaxis.  . Omega-3 Fatty Acids (FISH OIL PO) Take by mouth.  . Probiotic Product (PROBIOTIC PO) Take by mouth.  . TURMERIC PO Take by mouth daily.  Marland Kitchen VITAMIN D PO Take by mouth daily.   Social History   Tobacco Use  . Smoking status: Never Smoker  . Smokeless tobacco: Never Used  Substance Use Topics  . Alcohol use: Yes    Comment: social   Family History  Problem Relation Age of Onset  . High Cholesterol Mother   . Osteoporosis Mother   . COPD Mother   . Arthritis Mother        rheumatoid in hands; mild  . Hearing loss Mother   . High Cholesterol Father   . Arthritis Father        osteo  . Osteoporosis Father   . CAD Father   . Heart disease Maternal Grandmother        CHF; worsened after hip fracture  . Early death Maternal Grandfather        uncertain  cause  . Heart attack Paternal Grandmother 78  . Heart disease Paternal Grandmother   . Early death Paternal Grandfather 37       ?valve issue  . Heart attack Paternal Grandfather   . Heart disease Paternal Grandfather   . Breast cancer Neg Hx      Review of Systems  Constitutional: Negative for activity change, appetite change, chills, fatigue, fever and unexpected weight change.  HENT: Negative for congestion, ear pain, hearing loss, sinus pressure, sinus pain, sore throat and trouble swallowing.   Eyes: Negative for pain and visual disturbance.  Respiratory: Negative for cough, chest tightness, shortness of breath and wheezing.   Cardiovascular: Negative for chest  pain, palpitations and leg swelling.  Gastrointestinal: Positive for abdominal pain (rlq very mild discomfort; see hpi). Negative for blood in stool, constipation, diarrhea, nausea and vomiting.  Genitourinary: Negative for difficulty urinating and menstrual problem.  Musculoskeletal: Negative for arthralgias (fingers are twisting; some enlargement of joints. not terribly painful. mother with RA) and back pain.  Skin: Negative for rash.  Neurological: Negative for dizziness, weakness, numbness and headaches.  Hematological: Negative for adenopathy. Does not bruise/bleed easily.  Psychiatric/Behavioral: Negative for sleep disturbance and suicidal ideas. The patient is not nervous/anxious.     CBC:  Lab Results  Component Value Date   WBC 5.7 05/01/2020   HGB 14.7 05/01/2020   HCT 43.3 05/01/2020   MCHC 33.9 05/01/2020   RDW 13.1 05/01/2020   PLT 216.0 05/01/2020   CMP: Lab Results  Component Value Date   NA 140 05/01/2020   K 4.1 05/01/2020   CL 102 05/01/2020   CO2 27 05/01/2020   GLUCOSE 96 05/01/2020   BUN 24 (H) 05/01/2020   CREATININE 0.88 05/01/2020   LABGLOB 3.6 02/20/2019   CALCIUM 9.6 05/01/2020   PROT 7.2 05/01/2020   PROT 7.4 02/20/2019   AGRATIO 1.1 02/20/2019   BILITOT 0.5 05/01/2020   ALKPHOS 108 05/01/2020   ALT 16 05/01/2020   AST 14 05/01/2020   LIPID: Lab Results  Component Value Date   CHOL 251 (H) 05/01/2020   TRIG (H) 05/01/2020    489.0 Triglyceride is over 400; calculations on Lipids are invalid.   HDL 43.00 05/01/2020   LDLCALC 83 01/30/2016    Objective:  BP 100/72 (BP Location: Left Arm, Patient Position: Sitting, Cuff Size: Normal)   Pulse 68   Temp 97.7 F (36.5 C) (Oral)   Ht 5' 3.5" (1.613 m)   Wt 146 lb 1.6 oz (66.3 kg)   SpO2 98%   BMI 25.47 kg/m   Weight: 146 lb 1.6 oz (66.3 kg)   BP Readings from Last 3 Encounters:  05/01/20 100/72  04/13/19 121/66  02/20/19 110/80   Wt Readings from Last 3 Encounters:  05/01/20 146  lb 1.6 oz (66.3 kg)  04/13/19 149 lb 11.2 oz (67.9 kg)  02/20/19 148 lb 6.4 oz (67.3 kg)    Physical Exam Constitutional:      General: She is not in acute distress.    Appearance: She is well-developed.  HENT:     Head: Normocephalic and atraumatic.     Right Ear: External ear normal.     Left Ear: External ear normal.     Mouth/Throat:     Pharynx: No oropharyngeal exudate.  Eyes:     Conjunctiva/sclera: Conjunctivae normal.     Pupils: Pupils are equal, round, and reactive to light.  Neck:     Thyroid: No thyromegaly.  Cardiovascular:  Rate and Rhythm: Normal rate and regular rhythm.     Heart sounds: Normal heart sounds. No murmur heard. No friction rub. No gallop.   Pulmonary:     Effort: Pulmonary effort is normal.     Breath sounds: Normal breath sounds.  Abdominal:     General: Bowel sounds are normal. There is no distension.     Palpations: Abdomen is soft. There is no mass.     Tenderness: There is no abdominal tenderness. There is no guarding.     Hernia: No hernia is present.  Musculoskeletal:        General: No tenderness or deformity. Normal range of motion.     Cervical back: Normal range of motion and neck supple.     Comments: Twisting of bilat first fingers inward. +heberden nodules.   Lymphadenopathy:     Cervical: No cervical adenopathy.  Skin:    General: Skin is warm and dry.     Findings: No rash.  Neurological:     Mental Status: She is alert and oriented to person, place, and time.     Deep Tendon Reflexes: Reflexes normal.     Reflex Scores:      Tricep reflexes are 2+ on the right side and 2+ on the left side.      Bicep reflexes are 2+ on the right side and 2+ on the left side.      Brachioradialis reflexes are 2+ on the right side and 2+ on the left side.      Patellar reflexes are 2+ on the right side and 2+ on the left side. Psychiatric:        Speech: Speech normal.        Behavior: Behavior normal.        Thought Content: Thought  content normal.     Assessment/Plan: There are no preventive care reminders to display for this patient. Health Maintenance reviewed.  1. Preventative health care Keep up with exercise. Keep up with healthy eating.  2. Hyperlipidemia, unspecified hyperlipidemia type Diet controlled.  - Comprehensive metabolic panel; Future - Lipid panel; Future  3. Intraductal papilloma of left breast Following up with oncology. Has decided not to stay on tamoxifen for preventative.  4. Colon cancer screening - Ambulatory referral to Gastroenterology  5. Right lower quadrant abdominal pain Very mild at this point; she is reassured by evaluation that she has had. Due to timing of discomfort with certain types of activity, I wonder if this is more muscular related.  If worsening, would certainly consider having her follow-up with sports medicine or physical therapy to see if this could be further evaluated. - CBC with Differential/Platelet; Future  6. Vitamin D deficiency - VITAMIN D 25 Hydroxy (Vit-D Deficiency, Fractures); Future  7. Arthralgia, unspecified joint - ANA; Future - Sedimentation rate; Future - DG Hand Complete Left; Future  Return in about 1 year (around 05/01/2021) for physical exam.  Micheline Rough, MD

## 2020-05-03 LAB — ANA: Anti Nuclear Antibody (ANA): NEGATIVE

## 2020-05-09 NOTE — Addendum Note (Signed)
Addended by: Agnes Lawrence on: 05/09/2020 04:12 PM   Modules accepted: Orders

## 2020-05-15 DIAGNOSIS — Z1231 Encounter for screening mammogram for malignant neoplasm of breast: Secondary | ICD-10-CM | POA: Diagnosis not present

## 2020-05-15 DIAGNOSIS — Z13820 Encounter for screening for osteoporosis: Secondary | ICD-10-CM | POA: Diagnosis not present

## 2020-05-16 DIAGNOSIS — Z6824 Body mass index (BMI) 24.0-24.9, adult: Secondary | ICD-10-CM | POA: Diagnosis not present

## 2020-05-16 DIAGNOSIS — Z01419 Encounter for gynecological examination (general) (routine) without abnormal findings: Secondary | ICD-10-CM | POA: Diagnosis not present

## 2020-05-20 ENCOUNTER — Other Ambulatory Visit: Payer: Self-pay | Admitting: Obstetrics and Gynecology

## 2020-05-20 DIAGNOSIS — Z9189 Other specified personal risk factors, not elsewhere classified: Secondary | ICD-10-CM

## 2020-06-22 IMAGING — US ULTRASOUND LEFT BREAST LIMITED
1 series · 10 of 10 positions shown · non-contrast
Comparison: Previous exam(s).

CLINICAL DATA: Patient presents for palpable lumpiness within the
inferior right breast.

EXAM:
DIGITAL DIAGNOSTIC BILATERAL MAMMOGRAM WITH CAD AND TOMO
ULTRASOUND BILATERAL BREAST

[Series 1: ultrasound left breast limited · 0.06mm/px · 10 of 10 slices shown]
[im 1/10]
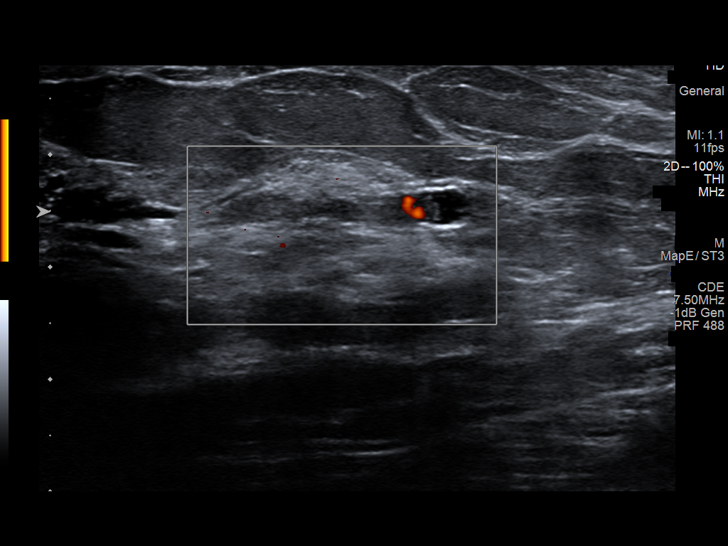
[im 2/10]
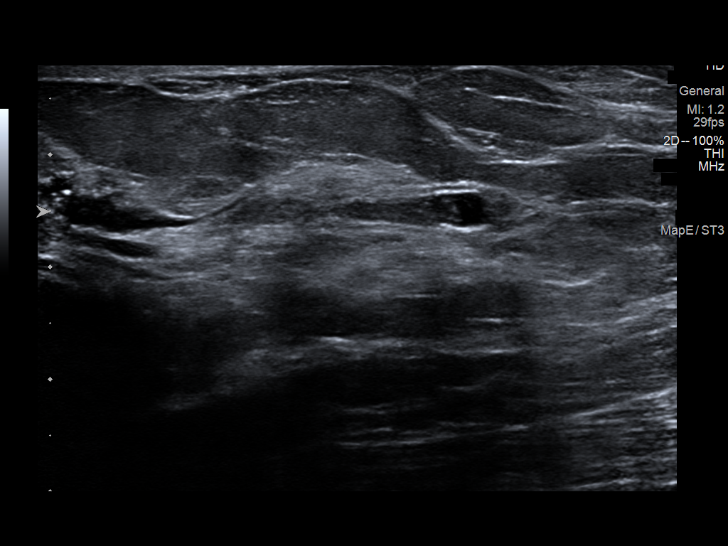
[im 3/10]
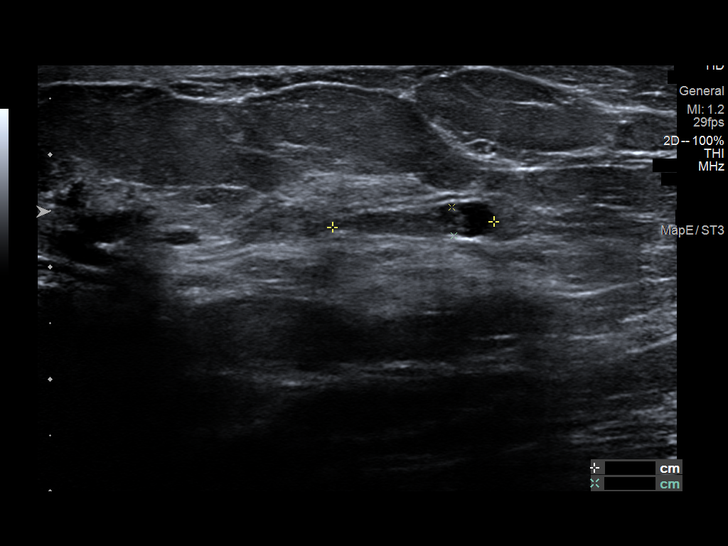
[im 4/10]
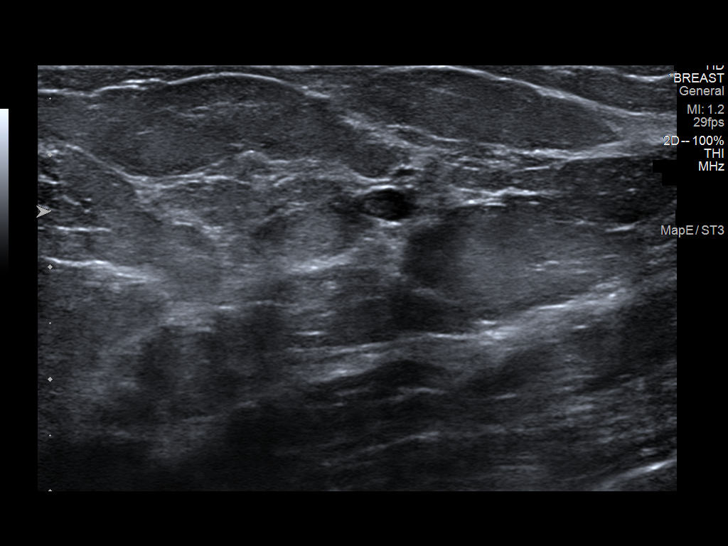
[im 5/10]
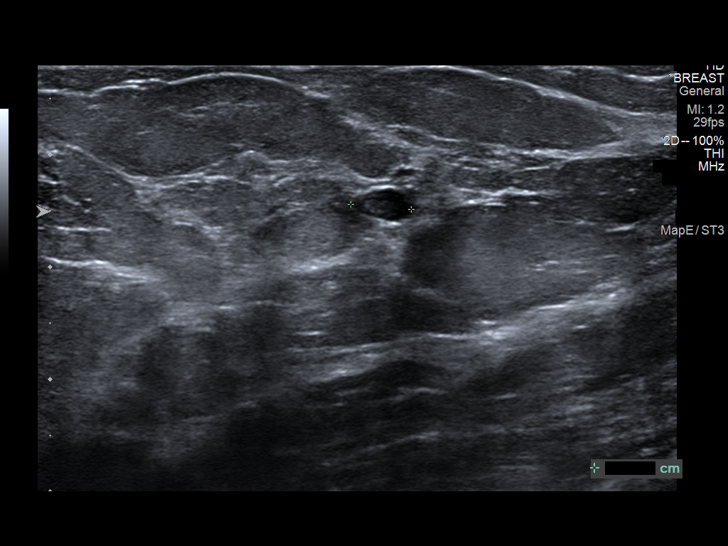
[im 6/10]
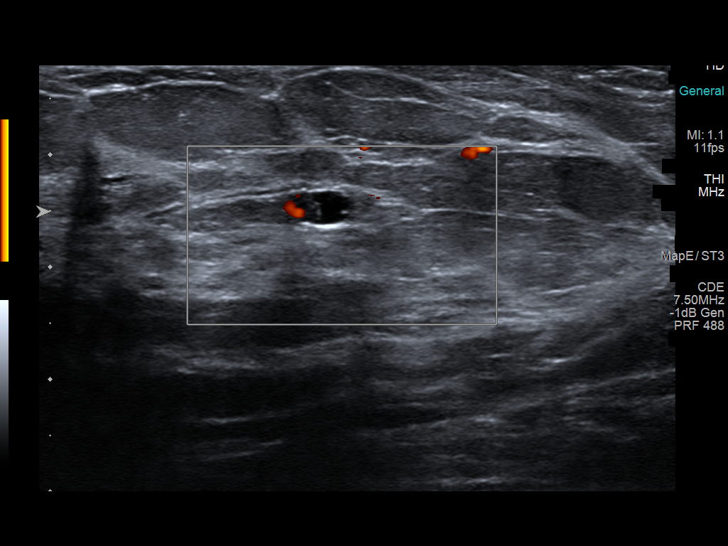
[im 7/10]
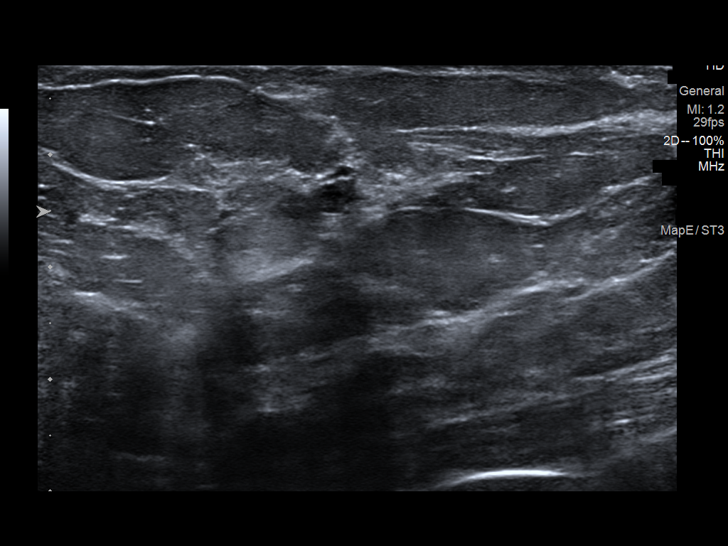
[im 8/10]
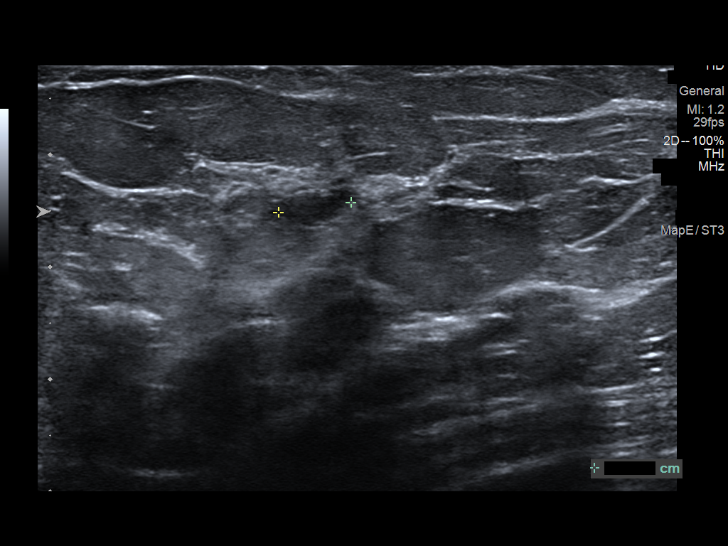
[im 9/10]
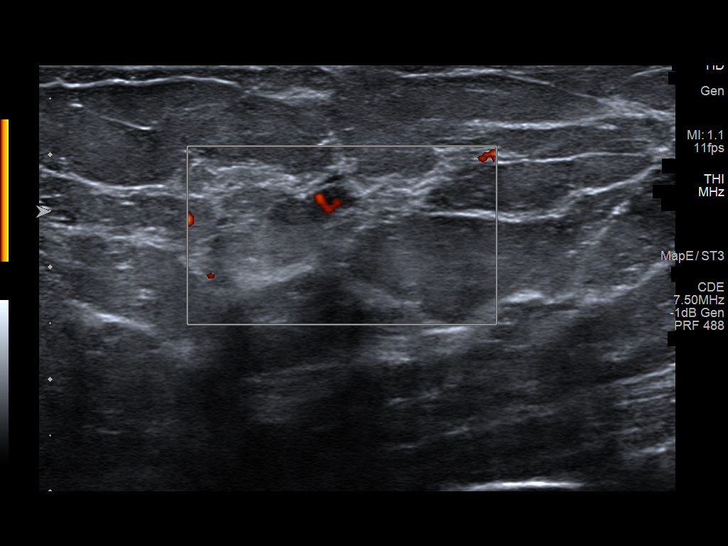
[im 10/10]
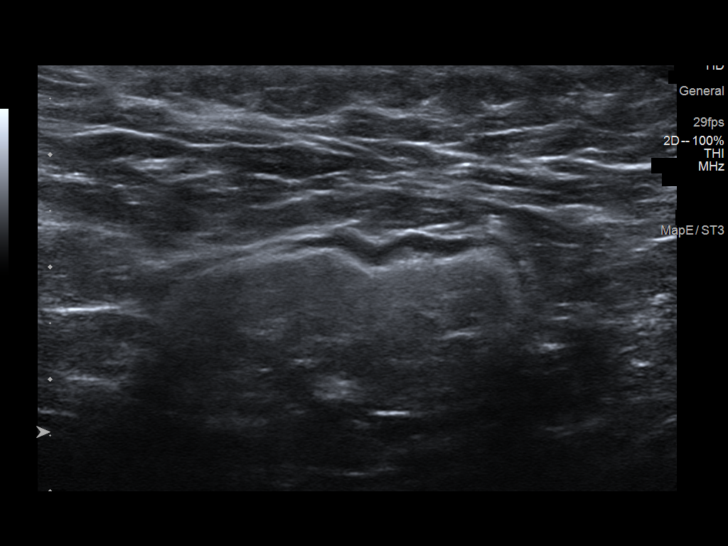

[10 of 10 positions shown; findings below may reference images not displayed]

ACR Breast Density Category c: The breast tissue is heterogeneously
dense, which may obscure small masses.
FINDINGS: There is a new oval circumscribed mass within the lateral aspect of
the left breast. No additional concerning masses, calcifications or
distortion identified within either breast.

Mammographic images were processed with CAD.

On physical exam, I palpate dense tissue within the inferior right
breast.

Targeted ultrasound is performed, showing a 1.4 x 0.3 x 0.5 cm solid
and cystic mass left breast 3:30 o'clock 3 cm from nipple. This is
felt to correspond with mammographic abnormality. No left axillary
adenopathy.

No suspicious abnormality within the right breast inferior mammary
fold at the site of palpable concern.
IMPRESSION: Indeterminate solid and cystic left breast mass 3:30 o'clock.

No suspicious abnormality at the site of palpable concern right
breast.

RECOMMENDATION:
Left breast ultrasound-guided core needle biopsy for mass 3:30
o'clock.

Continued clinical evaluation for right breast palpable concern.

I have discussed the findings and recommendations with the patient.
Results were also provided in writing at the conclusion of the
visit. If applicable, a reminder letter will be sent to the patient
regarding the next appointment.

BI-RADS CATEGORY  4: Suspicious.

## 2020-11-04 DIAGNOSIS — Z85828 Personal history of other malignant neoplasm of skin: Secondary | ICD-10-CM | POA: Diagnosis not present

## 2020-11-04 DIAGNOSIS — Z86018 Personal history of other benign neoplasm: Secondary | ICD-10-CM | POA: Diagnosis not present

## 2020-11-04 DIAGNOSIS — D2271 Melanocytic nevi of right lower limb, including hip: Secondary | ICD-10-CM | POA: Diagnosis not present

## 2020-11-04 DIAGNOSIS — D225 Melanocytic nevi of trunk: Secondary | ICD-10-CM | POA: Diagnosis not present

## 2020-12-04 ENCOUNTER — Ambulatory Visit
Admission: RE | Admit: 2020-12-04 | Discharge: 2020-12-04 | Disposition: A | Discharge status: Home / Self Care | Payer: BC Managed Care – PPO | Source: Ambulatory Visit | Attending: Obstetrics and Gynecology | Admitting: Obstetrics and Gynecology

## 2020-12-04 DIAGNOSIS — Z1239 Encounter for other screening for malignant neoplasm of breast: Secondary | ICD-10-CM | POA: Diagnosis not present

## 2020-12-04 DIAGNOSIS — Z9189 Other specified personal risk factors, not elsewhere classified: Secondary | ICD-10-CM

## 2020-12-04 MED ORDER — GADOBUTROL 1 MMOL/ML IV SOLN
7.0000 mL | Freq: Once | INTRAVENOUS | Status: AC | PRN
  Administered 2020-12-04: 7 mL via INTRAVENOUS

## 2020-12-13 ENCOUNTER — Telehealth: Payer: Self-pay | Admitting: Hematology and Oncology

## 2020-12-13 NOTE — Telephone Encounter (Signed)
Scheduled appointment per 11/23 scheduling message. Left message.

## 2020-12-25 ENCOUNTER — Encounter: Payer: Self-pay | Admitting: Gastroenterology

## 2021-02-10 ENCOUNTER — Encounter: Payer: BC Managed Care – PPO | Admitting: Gastroenterology

## 2021-02-19 ENCOUNTER — Ambulatory Visit: Payer: BC Managed Care – PPO | Admitting: Family Medicine

## 2021-02-19 VITALS — BP 112/78 | Ht 64.0 in | Wt 135.0 lb

## 2021-02-19 DIAGNOSIS — M7741 Metatarsalgia, right foot: Secondary | ICD-10-CM | POA: Diagnosis not present

## 2021-02-19 NOTE — Assessment & Plan Note (Signed)
Negative hop test, unlikely that this is a stress fracture.  Discussed with patient diagnosis of metatarsalgia.  Given size 7-1/2-8-1/2 women sports insoles and place a metatarsal pad on the right side.  Advised that this is painful and bothering her, that this likely needs to be adjusted and she can come back for that.  If the pain is not improving over the next few weeks, recommend follow-up.  At that time we will do an ultrasound.  Advised that she can use ibuprofen, Advil, Aleve as needed for pain.

## 2021-02-19 NOTE — Progress Notes (Signed)
° °  Jamie Avila is a 61 y.o. female who presents to [facility] today for the following:  Right foot pain Initially started in October She related this to trialing a new pair of shoes while she was walking in [place] before she went on a trip to [place] that after that she noticed that when she was walking on a treadmill on an incline States that she was able to walk all over [place] without any significant pain States that the pain worsens with walking with elevation or hills Is been off and on, but then a few weeks ago she started having pain after doing a lot of hill walking while she was in [place] with her son States has been more consistent since then The pain is on the dorsal aspect of her foot over the second and third metatarsal heads She has not taken any medicines for this  PMH reviewed.  ROS as above. Medications reviewed.  Exam:  BP 112/78    Ht 5\' 4"  (1.626 m)    Wt 135 lb (61.2 kg)    BMI 23.17 kg/m  Gen: Well NAD MSK:  Right Foot: Inspection: Collapse of the transverse arch on the right foot with a prominent second metatarsal head and callus formation over this area, there is no swelling or bruising Palpation: Tenderness palpation of the second metatarsal head plantarly, otherwise no tenderness palpation ROM: Full  ROM of the ankle b/l. Normal midfoot flexibility b/l Strength: 5/5 strength ankle in all planes b/l Neurovascular: N/V intact distally in the lower extremity b/l Special tests: Negative hop test  No results found.   Assessment and Plan: 1) Metatarsalgia of right foot Negative hop test, unlikely that this is a stress fracture.  Discussed with patient diagnosis of metatarsalgia.  Given size 7-1/2-8-1/2 women sports insoles and place a metatarsal pad on the right side.  Advised that this is painful and bothering her, that this likely needs to be adjusted and she can come back for that.  If the pain is not improving over the next few weeks,  recommend follow-up.  At that time we will do an ultrasound.  Advised that she can use ibuprofen, Advil, Aleve as needed for pain.   Arizona Constable, D.O.  PGY-4 [facility]  02/19/2021 2:58 PM  Addendum:  I was the preceptor for this visit and available for immediate consultation.  Karlton Lemon MD Kirt Boys

## 2021-03-03 ENCOUNTER — Ambulatory Visit (AMBULATORY_SURGERY_CENTER): Payer: BC Managed Care – PPO | Admitting: *Deleted

## 2021-03-03 ENCOUNTER — Other Ambulatory Visit: Payer: Self-pay

## 2021-03-03 VITALS — Ht 64.0 in | Wt 140.0 lb

## 2021-03-03 DIAGNOSIS — Z1211 Encounter for screening for malignant neoplasm of colon: Secondary | ICD-10-CM

## 2021-03-03 MED ORDER — NA SULFATE-K SULFATE-MG SULF 17.5-3.13-1.6 GM/177ML PO SOLN: 2.0000 | Freq: Once | ORAL | 0 refills | Status: AC

## 2021-03-03 MED ORDER — ONDANSETRON HCL 4 MG PO TABS: 4.0000 mg | ORAL_TABLET | ORAL | 0 refills | Status: DC

## 2021-03-03 NOTE — Progress Notes (Signed)

## 2021-03-17 ENCOUNTER — Encounter: Payer: BC Managed Care – PPO | Admitting: Gastroenterology

## 2021-03-19 NOTE — Progress Notes (Signed)
? ?Patient Care Team: ?Caren Macadam, MD as PCP - General (Family Medicine) ? ?DIAGNOSIS:  ?  ICD-10-CM   ?1. Intraductal papilloma of left breast  D24.2   ?  ? ? ?SUMMARY OF ONCOLOGIC HISTORY: ?Oncology History  ? No history exists.  ? ? ?CHIEF COMPLIANT: Follow-up of high risk of breast cancer ? ?INTERVAL HISTORY: Jamie Avila is a 61 y.o. with above-mentioned history of high risk of breast cancer having undergone left lumpectomy. MRI Breast on 12/04/2020 showed no evidence of malignancy bilaterally. She presents to the clinic today for follow-up.  She reports no new problems or concerns.  She did not take tamoxifen after thinking about risks and benefits. ? ?ALLERGIES:  is allergic to penicillins. ? ?MEDICATIONS:  ?Current Outpatient Medications  ?Medication Sig Dispense Refill  ? CALCIUM PO Take 600 mg by mouth daily. (Patient not taking: Reported on 03/03/2021)    ? EPINEPHrine 0.3 mg/0.3 mL IJ SOAJ injection Inject 0.3 mLs (0.3 mg total) into the muscle as needed for anaphylaxis. (Patient not taking: Reported on 03/03/2021) 1 each 0  ? ibandronate (BONIVA) 150 MG tablet Boniva 150 mg tablet ? Take 1 tablet every month by oral route. ? take one po monthly first thing in the morning on an empty stomack with full glass of water.  do not eat drink or eat anything for one hour.   do not lay down or bend over for one hour    ? Omega-3 Fatty Acids (FISH OIL PO) Take by mouth. (Patient not taking: Reported on 03/03/2021)    ? ondansetron (ZOFRAN) 4 MG tablet Take 1 tablet (4 mg total) by mouth as directed. Take 1 tablet 30-60 minutes prior to each colonoscopy prep dose 2 tablet 0  ? Probiotic Product (PROBIOTIC PO) Take by mouth. (Patient not taking: Reported on 03/03/2021)    ? TURMERIC PO Take by mouth daily. (Patient not taking: Reported on 03/03/2021)    ? VITAMIN D PO Take by mouth daily. (Patient not taking: Reported on 03/03/2021)    ? ?No current facility-administered medications for this visit.   ? ? ?PHYSICAL EXAMINATION: ?ECOG PERFORMANCE STATUS: 1 - Symptomatic but completely ambulatory ? ?Vitals:  ? 03/20/21 0946  ?BP: (!) 128/53  ?Pulse: 67  ?Resp: 17  ?Temp: (!) 97.5 ?F (36.4 ?C)  ?SpO2: 99%  ? ?Filed Weights  ? 03/20/21 0946  ?Weight: 147 lb 1.6 oz (66.7 kg)  ? ? ?BREAST: No palpable masses or nodules in either right or left breasts. No palpable axillary supraclavicular or infraclavicular adenopathy no breast tenderness or nipple discharge. (exam performed in the presence of a chaperone) ? ?LABORATORY DATA:  ?I have reviewed the data as listed ?CMP Latest Ref Rng & Units 05/01/2020 02/20/2019 02/15/2019  ?Glucose 70 - 99 mg/dL 96 - 144(H)  ?BUN 6 - 23 mg/dL 24(H) - 17  ?Creatinine 0.40 - 1.20 mg/dL 0.88 - 0.78  ?Sodium 135 - 145 mEq/L 140 - 139  ?Potassium 3.5 - 5.1 mEq/L 4.1 - 4.1  ?Chloride 96 - 112 mEq/L 102 - 102  ?CO2 19 - 32 mEq/L 27 - 29  ?Calcium 8.4 - 10.5 mg/dL 9.6 - 9.9  ?Total Protein 6.0 - 8.3 g/dL 7.2 7.4 6.9  ?Total Bilirubin 0.2 - 1.2 mg/dL 0.5 - 0.5  ?Alkaline Phos 39 - 117 U/L 108 - 113  ?AST 0 - 37 U/L 14 - 18  ?ALT 0 - 35 U/L 16 - 27  ? ? ?Lab Results  ?Component  Value Date  ? WBC 5.7 05/01/2020  ? HGB 14.7 05/01/2020  ? HCT 43.3 05/01/2020  ? MCV 91.6 05/01/2020  ? PLT 216.0 05/01/2020  ? NEUTROABS 3.2 05/01/2020  ? ? ?ASSESSMENT & PLAN:  ?Intraductal papilloma of left breast ?left lumpectomy 08/17/18 with Dr. Georgette Dover for which pathology showed intraductal papillomas, atypical ductal hyperplasia, and no evidence of malignancy. ?  ?Risk Assessment: Tyrer Cusick risk assessment: 28% lifetime risk ?NCI breast cancer risk assessment tool: 20% lifetime risk, 5-year risk: 3.7% ? ?Current Treatment: Patient was offered tamoxifen and she decided that she does not want to take it. ?  ?Breast Cancer Surveillance: ?Mammograms annually March ?Breast MRIs every other year ? ?Return to clinic on an as-needed basis ? ? ? ?No orders of the defined types were placed in this encounter. ? ?The patient has  a good understanding of the overall plan. she agrees with it. she will call with any problems that may develop before the next visit here. ? ?Total time spent: 20 mins including face to face time and time spent for planning, charting and coordination of care ? ?Rulon Eisenmenger, MD, MPH ?03/20/2021 ? ?I, Thana Ates, am acting as scribe for Dr. Nicholas Lose. ? ?I have reviewed the above documentation for accuracy and completeness, and I agree with the above. ? ? ? ? ? ? ?

## 2021-03-19 NOTE — Assessment & Plan Note (Signed)
left lumpectomy 08/17/18 with Dr. Georgette Dover for which pathology showed intraductal papillomas, atypical ductal hyperplasia, and no evidence of malignancy. ?? ?Risk Assessment: Tyrer Cusick risk assessment: 28% lifetime risk ?NCI breast cancer risk assessment tool: 20% lifetime risk, 5-year risk: 3.7% ? ?Current Treatment: Tamoxifen ?Tamoxifen Toxicities: ? ? ?Breast Cancer Surveillance: ? ?

## 2021-03-20 ENCOUNTER — Inpatient Hospital Stay: Payer: BC Managed Care – PPO | Attending: Hematology and Oncology | Admitting: Hematology and Oncology

## 2021-03-20 ENCOUNTER — Other Ambulatory Visit: Payer: Self-pay

## 2021-03-20 DIAGNOSIS — D242 Benign neoplasm of left breast: Secondary | ICD-10-CM | POA: Insufficient documentation

## 2021-04-18 ENCOUNTER — Encounter: Payer: Self-pay | Admitting: Gastroenterology

## 2021-04-22 ENCOUNTER — Encounter: Payer: Self-pay | Admitting: Gastroenterology

## 2021-04-22 ENCOUNTER — Ambulatory Visit (AMBULATORY_SURGERY_CENTER): Payer: BC Managed Care – PPO | Admitting: Gastroenterology

## 2021-04-22 VITALS — BP 110/58 | HR 57 | Temp 97.8°F | Resp 16 | Ht 63.5 in | Wt 140.0 lb

## 2021-04-22 DIAGNOSIS — D122 Benign neoplasm of ascending colon: Secondary | ICD-10-CM

## 2021-04-22 DIAGNOSIS — Z1211 Encounter for screening for malignant neoplasm of colon: Secondary | ICD-10-CM

## 2021-04-22 MED ORDER — SODIUM CHLORIDE 0.9 % IV SOLN: 500.0000 mL | Freq: Once | INTRAVENOUS | Status: DC

## 2021-04-22 NOTE — Progress Notes (Signed)
Called to room to assist during endoscopic procedure.  Patient ID and intended procedure confirmed with present staff. Received instructions for my participation in the procedure from the performing physician.  

## 2021-04-22 NOTE — Progress Notes (Signed)
A and O x3. Report to RN. Tolerated MAC anesthesia well. 

## 2021-04-22 NOTE — Progress Notes (Signed)
See 03/20/2021 H&P, no changes.  ?

## 2021-04-22 NOTE — Op Note (Signed)
[place] ?Patient Name: Jamie Avila ?Procedure Date: 04/22/2021 9:17 AM ?MRN: [id number] ?Endoscopist: Ladene Artist , MD ?Age: 61 ?Referring MD:  ?Date of Birth: [birth year] ?Gender: Female ?Account #: [id number] ?Procedure:                Colonoscopy ?Indications:              Screening for colorectal malignant neoplasm ?Medicines:                Monitored Anesthesia Care ?Procedure:                Pre-Anesthesia Assessment: ?                          - Prior to the procedure, a History and Physical  ?                          was performed, and patient medications and  ?                          allergies were reviewed. The patient's tolerance of  ?                          previous anesthesia was also reviewed. The risks  ?                          and benefits of the procedure and the sedation  ?                          options and risks were discussed with the patient.  ?                          All questions were answered, and informed consent  ?                          was obtained. Prior Anticoagulants: The patient has  ?                          taken no previous anticoagulant or antiplatelet  ?                          agents. ASA Grade Assessment: II - A patient with  ?                          mild systemic disease. After reviewing the risks  ?                          and benefits, the patient was deemed in  ?                          satisfactory condition to undergo the procedure. ?                          After obtaining informed consent, the colonoscope  ?  was passed under direct vision. Throughout the  ?                          procedure, the patient's blood pressure, pulse, and  ?                          oxygen saturations were monitored continuously. The  ?                          Olympus PCF-[id number] ([id number]) Colonoscope was  ?                          introduced through the anus and advanced to the the  ?                          cecum,  identified by appendiceal orifice and  ?                          ileocecal valve. The ileocecal valve, appendiceal  ?                          orifice, and rectum were photographed. The quality  ?                          of the bowel preparation was good. The colonoscopy  ?                          was somewhat difficult due to significant looping  ?                          and a tortuous colon. Successful completion of the  ?                          procedure was aided by straightening and shortening  ?                          the scope to obtain bowel loop reduction, using  ?                          scope torsion and applying abdominal pressure. The  ?                          patient tolerated the procedure well. ?Scope In: 9:29:38 AM ?Scope Out: 9:51:05 AM ?Scope Withdrawal Time: 0 hours 15 minutes 15 seconds  ?Total Procedure Duration: 0 hours 21 minutes 27 seconds  ?Findings:                 The perianal and digital rectal examinations were  ?                          normal. ?                          Two sessile polyps were found in the ascending  ?  colon. The polyps were 5 to 7 mm in size. These  ?                          polyps were removed with a cold snare. Resection  ?                          and retrieval were complete. ?                          The exam was otherwise without abnormality on  ?                          direct and retroflexion views. ?Complications:            No immediate complications. Estimated blood loss:  ?                          None. ?Estimated Blood Loss:     Estimated blood loss: none. ?Impression:               - Two 5 to 7 mm polyps in the ascending colon,  ?                          removed with a cold snare. Resected and retrieved. ?                          - The examination was otherwise normal on direct  ?                          and retroflexion views. ?Recommendation:           - Repeat colonoscopy after studies are complete for  ?                           surveillance based on pathology results. ?                          - Patient has a contact number available for  ?                          emergencies. The signs and symptoms of potential  ?                          delayed complications were discussed with the  ?                          patient. Return to normal activities tomorrow.  ?                          Written discharge instructions were provided to the  ?                          patient. ?                          - Resume previous  diet. ?                          - Continue present medications. ?                          - Await pathology results. ?Ladene Artist, MD ?04/22/2021 9:55:04 AM ?This report has been signed electronically. ?

## 2021-04-22 NOTE — Progress Notes (Signed)
Pt's states no medical or surgical changes since previsit or office visit. 

## 2021-04-22 NOTE — Patient Instructions (Signed)
YOU HAD AN ENDOSCOPIC PROCEDURE TODAY AT THE [facility]:   Refer to the procedure report that was given to you for any specific questions about what was found during the examination.  If the procedure report does not answer your questions, please call your gastroenterologist to clarify.  If you requested that your care partner not be given the details of your procedure findings, then the procedure report has been included in a sealed envelope for you to review at your convenience later.  YOU SHOULD EXPECT: Some feelings of bloating in the abdomen. Passage of more gas than usual.  Walking can help get rid of the air that was put into your GI tract during the procedure and reduce the bloating. If you had a lower endoscopy (such as a colonoscopy or flexible sigmoidoscopy) you may notice spotting of blood in your stool or on the toilet paper. If you underwent a bowel prep for your procedure, you may not have a normal bowel movement for a few days.  Please Note:  You might notice some irritation and congestion in your nose or some drainage.  This is from the oxygen used during your procedure.  There is no need for concern and it should clear up in a day or so.  SYMPTOMS TO REPORT IMMEDIATELY:   Following lower endoscopy (colonoscopy or flexible sigmoidoscopy):  Excessive amounts of blood in the stool  Significant tenderness or worsening of abdominal pains  Swelling of the abdomen that is new, acute  Fever of 100F or higher  For urgent or emergent issues, a gastroenterologist can be reached at any hour by calling [phone]. Do not use MyChart messaging for urgent concerns.    DIET:  We do recommend a small meal at first, but then you may proceed to your regular diet.  Drink plenty of fluids but you should avoid alcoholic beverages for 24 hours.  ACTIVITY:  You should plan to take it easy for the rest of today and you should NOT DRIVE or use heavy machinery until tomorrow (because  of the sedation medicines used during the test).    FOLLOW UP: Our staff will call the number listed on your records 48-72 hours following your procedure to check on you and address any questions or concerns that you may have regarding the information given to you following your procedure. If we do not reach you, we will leave a message.  We will attempt to reach you two times.  During this call, we will ask if you have developed any symptoms of COVID 19. If you develop any symptoms (ie: fever, flu-like symptoms, shortness of breath, cough etc.) before then, please call [phone].  If you test positive for Covid 19 in the 2 weeks post procedure, please call and report this information to us.    If any biopsies were taken you will be contacted by phone or by letter within the next 1-3 weeks.  Please call us at [phone] if you have not heard about the biopsies in 3 weeks.    SIGNATURES/CONFIDENTIALITY: You and/or your care partner have signed paperwork which will be entered into your electronic medical record.  These signatures attest to the fact that that the information above on your After Visit Summary has been reviewed and is understood.  Full responsibility of the confidentiality of this discharge information lies with you and/or your care-partner. 

## 2021-04-24 ENCOUNTER — Telehealth: Payer: Self-pay

## 2021-04-24 ENCOUNTER — Telehealth: Payer: Self-pay | Admitting: *Deleted

## 2021-04-24 NOTE — Telephone Encounter (Signed)
?  Follow up Call- ? ? ?  04/22/2021  ?  8:46 AM  ?Call back number  ?Post procedure Call Back phone  # [phone]  ?Permission to leave phone message Yes  ?  ? ?Patient questions: ? ?Do you have a fever, pain , or abdominal swelling? No. ?Pain Score  0 * ? ?Have you tolerated food without any problems? Yes.   ? ?Have you been able to return to your normal activities? Yes.   ? ?Do you have any questions about your discharge instructions: ?Diet   No. ?Medications  No. ?Follow up visit  No. ? ?Do you have questions or concerns about your Care? No. ? ?Actions: ?* If pain score is 4 or above: ?No action needed, pain <4. ? ? ?

## 2021-04-24 NOTE — Telephone Encounter (Signed)
?  Follow up Call- ? ? ?  04/22/2021  ?  8:46 AM  ?Call back number  ?Post procedure Call Back phone  # [phone]  ?Permission to leave phone message Yes  ? First follow up call, LVM ?

## 2021-05-02 ENCOUNTER — Encounter: Payer: BC Managed Care – PPO | Admitting: Family Medicine

## 2021-05-05 ENCOUNTER — Encounter: Payer: Self-pay | Admitting: Family Medicine

## 2021-05-05 ENCOUNTER — Encounter: Payer: Self-pay | Admitting: Gastroenterology

## 2021-05-05 ENCOUNTER — Ambulatory Visit (INDEPENDENT_AMBULATORY_CARE_PROVIDER_SITE_OTHER): Payer: BC Managed Care – PPO | Admitting: Family Medicine

## 2021-05-05 VITALS — BP 100/70 | HR 56 | Temp 97.8°F | Ht 63.5 in | Wt 143.5 lb

## 2021-05-05 DIAGNOSIS — E785 Hyperlipidemia, unspecified: Secondary | ICD-10-CM

## 2021-05-05 DIAGNOSIS — R7309 Other abnormal glucose: Secondary | ICD-10-CM | POA: Diagnosis not present

## 2021-05-05 DIAGNOSIS — D242 Benign neoplasm of left breast: Secondary | ICD-10-CM

## 2021-05-05 DIAGNOSIS — E559 Vitamin D deficiency, unspecified: Secondary | ICD-10-CM

## 2021-05-05 DIAGNOSIS — Z0000 Encounter for general adult medical examination without abnormal findings: Secondary | ICD-10-CM | POA: Diagnosis not present

## 2021-05-05 DIAGNOSIS — M810 Age-related osteoporosis without current pathological fracture: Secondary | ICD-10-CM

## 2021-05-05 LAB — COMPREHENSIVE METABOLIC PANEL
ALT: 22 U/L (ref 0–35)
AST: 19 U/L (ref 0–37)
Albumin: 4.9 g/dL (ref 3.5–5.2)
Alkaline Phosphatase: 135 U/L — ABNORMAL HIGH (ref 39–117)
BUN: 18 mg/dL (ref 6–23)
CO2: 27 mEq/L (ref 19–32)
Calcium: 10.2 mg/dL (ref 8.4–10.5)
Chloride: 103 mEq/L (ref 96–112)
Creatinine, Ser: 0.78 mg/dL (ref 0.40–1.20)
GFR: 82.16 mL/min (ref >60.00)
Glucose, Bld: 94 mg/dL (ref 70–99)
Potassium: 4.6 mEq/L (ref 3.5–5.1)
Sodium: 139 mEq/L (ref 135–145)
Total Bilirubin: 0.9 mg/dL (ref 0.2–1.2)
Total Protein: 8.2 g/dL (ref 6.0–8.3)

## 2021-05-05 LAB — LIPID PANEL
Cholesterol: 258 mg/dL — ABNORMAL HIGH (ref 0–200)
HDL: 48.9 mg/dL (ref >39.00)
NonHDL: 209.17
Total CHOL/HDL Ratio: 5
Triglycerides: 285 mg/dL — ABNORMAL HIGH (ref 0.0–149.0)
VLDL: 57 mg/dL — ABNORMAL HIGH (ref 0.0–40.0)

## 2021-05-05 LAB — CBC WITH DIFFERENTIAL/PLATELET
Basophils Absolute: 0.1 10*3/uL (ref 0.0–0.1)
Basophils Relative: 1.3 % (ref 0.0–3.0)
Eosinophils Absolute: 0.1 10*3/uL (ref 0.0–0.7)
Eosinophils Relative: 1.2 % (ref 0.0–5.0)
HCT: 46 % (ref 36.0–46.0)
Hemoglobin: 15.4 g/dL — ABNORMAL HIGH (ref 12.0–15.0)
Lymphocytes Relative: 36.6 % (ref 12.0–46.0)
Lymphs Abs: 2 10*3/uL (ref 0.7–4.0)
MCHC: 33.4 g/dL (ref 30.0–36.0)
MCV: 91.9 fl (ref 78.0–100.0)
Monocytes Absolute: 0.4 10*3/uL (ref 0.1–1.0)
Monocytes Relative: 7.1 % (ref 3.0–12.0)
Neutro Abs: 2.9 10*3/uL (ref 1.4–7.7)
Neutrophils Relative %: 53.8 % (ref 43.0–77.0)
Platelets: 240 10*3/uL (ref 150.0–400.0)
RBC: 5.01 Mil/uL (ref 3.87–5.11)
RDW: 13.3 % (ref 11.5–15.5)
WBC: 5.4 10*3/uL (ref 4.0–10.5)

## 2021-05-05 LAB — HEMOGLOBIN A1C: Hgb A1c MFr Bld: 5.5 % (ref 4.6–6.5)

## 2021-05-05 LAB — LDL CHOLESTEROL, DIRECT: Direct LDL: 149 mg/dL

## 2021-05-05 LAB — VITAMIN D 25 HYDROXY (VIT D DEFICIENCY, FRACTURES): VITD: 30.34 ng/mL (ref 30.00–100.00)

## 2021-05-05 NOTE — Patient Instructions (Signed)
*  dr. Jerilee Hoh, dr. Marcell Anger ?

## 2021-05-05 NOTE — Progress Notes (Signed)
Jamie Avila ?DOB: [birth year] ?Encounter date: 05/05/2021 ? ?This is a 61 y.o. female who presents for complete physical  ? ?History of present illness/Additional concerns: ? ?History of intraductal papilloma left breast.  She had a left lumpectomy 07/2018 which revealed atypical ductal hyperplasia no evidence of malignancy.  She follows now as needed with oncology, last visit was in March of this year.  She is elected not to take tamoxifen.  She has annual mammograms in March with breast MRI every other year. Last MRI was 11/2020 and was normal. Has obgyn appointment in may and will do mammogram at that time.  ? ?Last visit with me was 04/2020 for complete physical.  She completed colonoscopy 04/22/2021. ? ?Past Medical History:  ?Diagnosis Date  ? GERD (gastroesophageal reflux disease)   ? Left breast mass   ? Ovarian cyst 1982  ? Peroneal tendinitis, left 05/26/2011  ? Proximal humerus fracture 2016  ? Right knee pain 09/04/2010  ? Sacroiliac joint pain 09/22/2011  ? Pain localizes directly over SIJ SIJ movement seemed OK   ? Shoulder joint dislocation 10/16/2010  ? She had a posterior glenohumeral joint dislocation on the right and a partial a.c. joint dislocation on the left from a fall while hiking   ? ?Past Surgical History:  ?Procedure Laterality Date  ? BREAST LUMPECTOMY WITH RADIOACTIVE SEED LOCALIZATION Left 08/17/2018  ? Procedure: LEFT BREAST LUMPECTOMY WITH RADIOACTIVE SEED LOCALIZATION;  Surgeon: Donnie Mesa, MD;  Location: [facility];  Service: General;  Laterality: Left;  ? COLONOSCOPY  2012  ? repeat 2022  ? HUMERUS FRACTURE SURGERY  2016  ? HUMERUS FRACTURE SURGERY Right   ? OVARIAN CYST REMOVAL    ? ?Allergies  ?Allergen Reactions  ? Fosamax [Alendronate]   ?  heartburn  ? Penicillins Rash  ? ?Current Meds  ?Medication Sig  ? CALCIUM PO Take 600 mg by mouth daily.  ? Omega-3 Fatty Acids (FISH OIL PO) Take by mouth.  ? Probiotic Product (PROBIOTIC PO) Take by mouth.  ? TURMERIC PO  Take by mouth daily.  ? VITAMIN D PO Take by mouth daily.  ? [DISCONTINUED] ibandronate (BONIVA) 150 MG tablet   ? [DISCONTINUED] ondansetron (ZOFRAN) 4 MG tablet Take 1 tablet (4 mg total) by mouth as directed. Take 1 tablet 30-60 minutes prior to each colonoscopy prep dose  ? ?Social History  ? ?Tobacco Use  ? Smoking status: Never  ? Smokeless tobacco: Never  ?Substance Use Topics  ? Alcohol use: Yes  ?  Comment: social  ? ?Family History  ?Problem Relation Age of Onset  ? High Cholesterol Mother   ? Osteoporosis Mother   ? COPD Mother   ? Arthritis Mother   ?     rheumatoid in hands; mild  ? Hearing loss Mother   ? Colon polyps Father   ? High Cholesterol Father   ? Arthritis Father   ?     osteo  ? Osteoporosis Father   ? CAD Father   ? Heart disease Maternal Grandmother   ?     CHF; worsened after hip fracture  ? Early death Maternal Grandfather   ?     uncertain cause  ? Heart attack Paternal Grandmother 68  ? Heart disease Paternal Grandmother   ? Early death Paternal Grandfather 39  ?     ?valve issue  ? Heart attack Paternal Grandfather   ? Heart disease Paternal Grandfather   ? Breast cancer Neg Hx   ?  Colon cancer Neg Hx   ? Esophageal cancer Neg Hx   ? Rectal cancer Neg Hx   ? Stomach cancer Neg Hx   ? ? ? ?Review of Systems  ?Constitutional:  Negative for activity change, appetite change, chills, fatigue, fever and unexpected weight change.  ?HENT:  Negative for congestion, ear pain, hearing loss, sinus pressure, sinus pain, sore throat and trouble swallowing.   ?     If around loud noise exposure, she will get a buzzing sensation in the ears.  She plans to follow-up with her son (who is an ENT) for this.  ?Eyes:  Negative for pain and visual disturbance.  ?Respiratory:  Negative for cough, chest tightness, shortness of breath and wheezing.   ?Cardiovascular:  Negative for chest pain, palpitations and leg swelling.  ?Gastrointestinal:  Negative for abdominal pain, blood in stool, constipation,  diarrhea, nausea and vomiting.  ?Genitourinary:  Negative for difficulty urinating and menstrual problem.  ?Musculoskeletal:  Negative for arthralgias and back pain.  ?Skin:  Negative for rash.  ?Neurological:  Negative for dizziness, weakness, numbness and headaches.  ?Hematological:  Negative for adenopathy. Does not bruise/bleed easily.  ?Psychiatric/Behavioral:  Negative for sleep disturbance and suicidal ideas. The patient is not nervous/anxious.   ? ?CBC:  ?Lab Results  ?Component Value Date  ? WBC 5.4 05/05/2021  ? HGB 15.4 (H) 05/05/2021  ? HCT 46.0 05/05/2021  ? MCHC 33.4 05/05/2021  ? RDW 13.3 05/05/2021  ? PLT 240.0 05/05/2021  ? ?CMP: ?Lab Results  ?Component Value Date  ? NA 139 05/05/2021  ? K 4.6 05/05/2021  ? CL 103 05/05/2021  ? CO2 27 05/05/2021  ? GLUCOSE 94 05/05/2021  ? BUN 18 05/05/2021  ? CREATININE 0.78 05/05/2021  ? LABGLOB 3.6 02/20/2019  ? CALCIUM 10.2 05/05/2021  ? PROT 8.2 05/05/2021  ? PROT 7.4 02/20/2019  ? AGRATIO 1.1 02/20/2019  ? BILITOT 0.9 05/05/2021  ? ALKPHOS 135 (H) 05/05/2021  ? ALT 22 05/05/2021  ? AST 19 05/05/2021  ? ?LIPID: ?Lab Results  ?Component Value Date  ? CHOL 258 (H) 05/05/2021  ? TRIG 285.0 (H) 05/05/2021  ? HDL 48.90 05/05/2021  ? [place] 83 01/30/2016  ? ? ?Objective: ? ?BP 100/70 (BP Location: Left Arm, Patient Position: Sitting, Cuff Size: Normal)   Pulse (!) 56   Temp 97.8 ?F (36.6 ?C) (Oral)   Ht 5' 3.5" (1.613 m)   Wt 143 lb 8 oz (65.1 kg)   SpO2 100%   BMI 25.02 kg/m?   Weight: 143 lb 8 oz (65.1 kg)  ? ?BP Readings from Last 3 Encounters:  ?05/05/21 100/70  ?04/22/21 (!) 110/58  ?03/20/21 (!) 128/53  ? ?Wt Readings from Last 3 Encounters:  ?05/05/21 143 lb 8 oz (65.1 kg)  ?04/22/21 140 lb (63.5 kg)  ?03/20/21 147 lb 1.6 oz (66.7 kg)  ? ? ?Physical Exam ?Constitutional:   ?   General: She is not in acute distress. ?   Appearance: She is well-developed.  ?HENT:  ?   Head: Normocephalic and atraumatic.  ?   Right Ear: External ear normal.  ?   Left Ear:  External ear normal.  ?   Mouth/Throat:  ?   Pharynx: No oropharyngeal exudate.  ?Eyes:  ?   Conjunctiva/sclera: Conjunctivae normal.  ?   Pupils: Pupils are equal, round, and reactive to light.  ?Neck:  ?   Thyroid: No thyromegaly.  ?Cardiovascular:  ?   Rate and Rhythm: Normal rate and regular  rhythm.  ?   Heart sounds: Normal heart sounds. No murmur heard. ?  No friction rub. No gallop.  ?Pulmonary:  ?   Effort: Pulmonary effort is normal.  ?   Breath sounds: Normal breath sounds.  ?Abdominal:  ?   General: Bowel sounds are normal. There is no distension.  ?   Palpations: Abdomen is soft. There is no mass.  ?   Tenderness: There is no abdominal tenderness. There is no guarding.  ?   Hernia: No hernia is present.  ?Musculoskeletal:     ?   General: No tenderness or deformity. Normal range of motion.  ?   Cervical back: Normal range of motion and neck supple.  ?Lymphadenopathy:  ?   Cervical: No cervical adenopathy.  ?Skin: ?   General: Skin is warm and dry.  ?   Findings: No rash.  ?Neurological:  ?   Mental Status: She is alert and oriented to person, place, and time.  ?   Deep Tendon Reflexes: Reflexes normal.  ?   Reflex Scores: ?     Tricep reflexes are 2+ on the right side and 2+ on the left side. ?     Bicep reflexes are 2+ on the right side and 2+ on the left side. ?     Brachioradialis reflexes are 2+ on the right side and 2+ on the left side. ?     Patellar reflexes are 2+ on the right side and 2+ on the left side. ?Psychiatric:     ?   Speech: Speech normal.     ?   Behavior: Behavior normal.     ?   Thought Content: Thought content normal.  ? ? ?Assessment/Plan: ?Health Maintenance Due  ?Topic Date Due  ? PAP SMEAR-Modifier  03/19/2021  ? ?Health Maintenance reviewed. ? ?1. Preventative health care ?Keep up with healthy lifestyle and regular exercise. ? ?2. Vitamin D deficiency ?She does currently supplement vitamin D. ?- VITAMIN D 25 Hydroxy (Vit-D Deficiency, Fractures); Future ?- VITAMIN D 25 Hydroxy  (Vit-D Deficiency, Fractures) ? ?3. Hyperlipidemia, unspecified hyperlipidemia type ?Interested in preventative measures for heart disease.  After discussion of possibilities for evaluation, she would like to procee

## 2021-05-05 NOTE — Progress Notes (Signed)
Hi, We will contact her with the results and follow up. We inform patients to expect 2 weeks from the procedure for routine path result reports. Thanks, MS ?

## 2021-05-06 LAB — NMR, LIPOPROFILE
Cholesterol, Total: 270 mg/dL — ABNORMAL HIGH (ref 100–199)
HDL Particle Number: 34.7 umol/L (ref >=30.5)
HDL-C: 51 mg/dL (ref >39)
LDL Particle Number: 1450 nmol/L — ABNORMAL HIGH (ref <1000)
LDL Size: 20.3 nm — ABNORMAL LOW (ref >20.5)
LDL-C (NIH Calc): 166 mg/dL — ABNORMAL HIGH (ref 0–99)
LP-IR Score: 53 — ABNORMAL HIGH (ref <=45)
Small LDL Particle Number: 838 nmol/L — ABNORMAL HIGH (ref <=527)
Triglycerides: 283 mg/dL — ABNORMAL HIGH (ref 0–149)

## 2021-05-09 ENCOUNTER — Telehealth: Payer: Self-pay | Admitting: *Deleted

## 2021-05-09 NOTE — Telephone Encounter (Signed)
-----   Message from Caren Macadam, MD sent at 05/08/2021  1:37 PM EDT ----- ?Did patient have dexa after 2018? I don't have report if she did. I know she was working on lifestyle changes tohelp, but if she didn't have repeat, we can order for comparison to previous. If she had bad reaction to boniva (I believe obgyn wrote this) please put in allergy list. Thanks! ?

## 2021-05-09 NOTE — Telephone Encounter (Signed)
Spoke with the patient and she stated she had a bone density test at [facility] last year.  I called and their office switchboard is closed at this time.  States she did not have an allergic reaction, just decided to not take the medication that was given by her OB/GYN.  Message sent to PCP. ?

## 2021-06-04 DIAGNOSIS — Z01419 Encounter for gynecological examination (general) (routine) without abnormal findings: Secondary | ICD-10-CM | POA: Diagnosis not present

## 2021-06-04 DIAGNOSIS — Z124 Encounter for screening for malignant neoplasm of cervix: Secondary | ICD-10-CM | POA: Diagnosis not present

## 2021-06-04 DIAGNOSIS — Z1231 Encounter for screening mammogram for malignant neoplasm of breast: Secondary | ICD-10-CM | POA: Diagnosis not present

## 2021-06-04 DIAGNOSIS — Z6825 Body mass index (BMI) 25.0-25.9, adult: Secondary | ICD-10-CM | POA: Diagnosis not present

## 2021-06-04 DIAGNOSIS — Z1151 Encounter for screening for human papillomavirus (HPV): Secondary | ICD-10-CM | POA: Diagnosis not present

## 2021-06-04 LAB — HM MAMMOGRAPHY

## 2021-06-09 LAB — HM PAP SMEAR

## 2021-06-18 ENCOUNTER — Ambulatory Visit
Admission: RE | Admit: 2021-06-18 | Discharge: 2021-06-18 | Disposition: A | Discharge status: Home / Self Care | Payer: Self-pay | Source: Ambulatory Visit | Attending: Family Medicine | Admitting: Family Medicine

## 2021-06-18 DIAGNOSIS — E785 Hyperlipidemia, unspecified: Secondary | ICD-10-CM

## 2021-06-24 ENCOUNTER — Encounter: Payer: Self-pay | Admitting: Internal Medicine

## 2021-06-24 ENCOUNTER — Ambulatory Visit: Payer: BC Managed Care – PPO | Admitting: Internal Medicine

## 2021-06-24 VITALS — BP 118/78 | HR 73 | Temp 98.3°F | Ht 63.5 in | Wt 143.1 lb

## 2021-06-24 DIAGNOSIS — D242 Benign neoplasm of left breast: Secondary | ICD-10-CM | POA: Diagnosis not present

## 2021-06-24 DIAGNOSIS — R87619 Unspecified abnormal cytological findings in specimens from cervix uteri: Secondary | ICD-10-CM

## 2021-06-24 DIAGNOSIS — E785 Hyperlipidemia, unspecified: Secondary | ICD-10-CM

## 2021-06-24 DIAGNOSIS — E559 Vitamin D deficiency, unspecified: Secondary | ICD-10-CM | POA: Diagnosis not present

## 2021-06-24 NOTE — Progress Notes (Signed)
Established Patient Office Visit     CC/Reason for Visit: Establish care, discuss chronic conditions  HPI: Jamie Avila is a 61 y.o. female who is coming in today for the above mentioned reasons. Past Medical History is significant for: Left breast papilloma removed in 2018, pathology did note some intraductal hyperplasia.  She is following with oncology and recommendation has been made for a heightened screening protocol with yearly mammograms alternating with yearly breast MRIs.  It was also advised that she take tamoxifen but she declined.  She was recently found on a Pap smear to have high risk HPV but normal cells.  She tells me she will be visiting her GYN again in 6 months.  She has also always had hyperlipidemia.  Her most recent lipid panel in April 2023 with a total cholesterol of 270, triglycerides 285 and LDL 166.  She has been hesitant to start statins and because of that her previous PCP ordered a coronary CT scan that came back with a calcium score of 0.  She does not smoke, she does not drink, she has allergies to penicillin, her surgeries in addition to her left breast lumpectomy she has had a right large ovarian cyst removed and a right shoulder surgery.  She has 3 grown children.  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   Past Medical/Surgical History: Past Medical History:  Diagnosis Date   GERD (gastroesophageal reflux disease)    Left breast mass    Ovarian cyst 1982   Peroneal tendinitis, left 05/26/2011   Proximal humerus fracture 2016   Right knee pain 09/04/2010   Sacroiliac joint pain 09/22/2011   Pain localizes directly over SIJ SIJ movement seemed OK    Shoulder joint dislocation 10/16/2010   She had a posterior glenohumeral joint dislocation on the right and a partial a.c. joint dislocation on the left from a fall while hiking     Past Surgical History:  Procedure Laterality Date   BREAST LUMPECTOMY WITH RADIOACTIVE SEED LOCALIZATION Left 08/17/2018    Procedure: LEFT BREAST LUMPECTOMY WITH RADIOACTIVE SEED LOCALIZATION;  Surgeon: Donnie Mesa, MD;  Location: [facility];  Service: General;  Laterality: Left;   COLONOSCOPY  2012   repeat 2022   HUMERUS FRACTURE SURGERY  2016   HUMERUS FRACTURE SURGERY Right    OVARIAN CYST REMOVAL      Social History:  reports that she has never smoked. She has never used smokeless tobacco. She reports current alcohol use. She reports that she does not use drugs.  Allergies: Allergies  Allergen Reactions   Penicillins Rash    Family History:  Family History  Problem Relation Age of Onset   High Cholesterol Mother    Osteoporosis Mother    COPD Mother    Arthritis Mother        rheumatoid in hands; mild   Hearing loss Mother    Colon polyps Father    High Cholesterol Father    Arthritis Father        osteo   Osteoporosis Father    CAD Father    Heart disease Maternal Grandmother        CHF; worsened after hip fracture   Early death Maternal Grandfather        uncertain cause   Heart attack Paternal Grandmother 17   Heart disease Paternal Grandmother    Early death Paternal Grandfather 47       ?valve issue   Heart attack Paternal  Grandfather    Heart disease Paternal Grandfather    Breast cancer Neg Hx    Colon cancer Neg Hx    Esophageal cancer Neg Hx    Rectal cancer Neg Hx    Stomach cancer Neg Hx      Current Outpatient Medications:    CALCIUM PO, Take 600 mg by mouth daily., Disp: , Rfl:    Omega-3 Fatty Acids (FISH OIL PO), Take by mouth., Disp: , Rfl:    Probiotic Product (PROBIOTIC PO), Take by mouth., Disp: , Rfl:    TURMERIC PO, Take by mouth daily., Disp: , Rfl:    VITAMIN D PO, Take by mouth daily., Disp: , Rfl:   Review of Systems:  Constitutional: Denies fever, chills, diaphoresis, appetite change and fatigue.  HEENT: Denies photophobia, eye pain, redness, hearing loss, ear pain, congestion, sore throat, rhinorrhea, sneezing, mouth sores,  trouble swallowing, neck pain, neck stiffness and tinnitus.   Respiratory: Denies SOB, DOE, cough, chest tightness,  and wheezing.   Cardiovascular: Denies chest pain, palpitations and leg swelling.  Gastrointestinal: Denies nausea, vomiting, abdominal pain, diarrhea, constipation, blood in stool and abdominal distention.  Genitourinary: Denies dysuria, urgency, frequency, hematuria, flank pain and difficulty urinating.  Endocrine: Denies: hot or cold intolerance, sweats, changes in hair or nails, polyuria, polydipsia. Musculoskeletal: Denies myalgias, back pain, joint swelling, arthralgias and gait problem.  Skin: Denies pallor, rash and wound.  Neurological: Denies dizziness, seizures, syncope, weakness, light-headedness, numbness and headaches.  Hematological: Denies adenopathy. Easy bruising, personal or family bleeding history  Psychiatric/Behavioral: Denies suicidal ideation, mood changes, confusion, nervousness, sleep disturbance and agitation    Physical Exam: Vitals:   06/24/21 1438  BP: 118/78  Pulse: 73  Temp: 98.3 F (36.8 C)  TempSrc: Oral  SpO2: 98%  Weight: 143 lb 1.6 oz (64.9 kg)  Height: 5' 3.5" (1.613 m)    Body mass index is 24.95 kg/m.   Constitutional: NAD, calm, comfortable Eyes: PERRL, lids and conjunctivae normal ENMT: Mucous membranes are moist.  Neurologic: Grossly intact and nonfocal Psychiatric: Normal judgment and insight. Alert and oriented x 3. Normal mood.    Impression and Plan:  Intraductal papilloma of left breast  Hyperlipidemia, unspecified hyperlipidemia type  Vitamin D deficiency  Abnormal cervical Papanicolaou smear, unspecified abnormal pap finding  -I would agree with her heightened breast cancer screening protocol, I also agree with returning to GYN in 6 months due to high risk HPV noted on recent Pap smear. -With a calcium score of 0 on her recent coronary CT, I feel it is okay to monitor her without statin given her  hesitancy to start it. -She will return in April 2024 for her annual physical.  Time spent:36 minutes reviewing chart, interviewing and examining patient and formulating plan of care.    Lelon Frohlich, MD [facility]

## 2021-06-26 ENCOUNTER — Encounter: Payer: Self-pay | Admitting: Internal Medicine

## 2021-10-06 ENCOUNTER — Ambulatory Visit: Payer: BC Managed Care – PPO | Admitting: Internal Medicine

## 2021-10-21 ENCOUNTER — Ambulatory Visit: Payer: BC Managed Care – PPO | Admitting: Family Medicine

## 2021-10-21 ENCOUNTER — Encounter: Payer: Self-pay | Admitting: Family Medicine

## 2021-10-21 VITALS — BP 110/78 | HR 70 | Temp 97.8°F | Wt 145.0 lb

## 2021-10-21 DIAGNOSIS — R42 Dizziness and giddiness: Secondary | ICD-10-CM

## 2021-10-21 DIAGNOSIS — J019 Acute sinusitis, unspecified: Secondary | ICD-10-CM

## 2021-10-21 DIAGNOSIS — H65192 Other acute nonsuppurative otitis media, left ear: Secondary | ICD-10-CM | POA: Diagnosis not present

## 2021-10-21 MED ORDER — MECLIZINE HCL 25 MG PO TABS: 25.0000 mg | ORAL_TABLET | ORAL | 0 refills | Status: DC | PRN

## 2021-10-21 MED ORDER — DOXYCYCLINE HYCLATE 100 MG PO CAPS: 100.0000 mg | ORAL_CAPSULE | Freq: Two times a day (BID) | ORAL | 0 refills | Status: AC

## 2021-10-21 NOTE — Progress Notes (Signed)
   Subjective:    Patient ID: Jamie Avila, female    DOB: 03-09-1960, 61 y.o.   MRN: [id number]  HPI Here for several issues. First she has had sinus congestion with PND for the past 6 weeks. No fever or ST or cough. She has been taking either Zyrtec or Allegra with little relief. Then about 4 weeks ago she began to have bouts of vertigo, where it seems as of the room was spinning. She has had vertigo off and on for years, but it has been lasting longer lately. Now she has also developed pain in the left ear for 2 days.   Review of Systems  Constitutional: Negative.   HENT:  Positive for congestion, ear pain, postnasal drip and sinus pressure. Negative for sore throat.   Eyes: Negative.   Respiratory: Negative.    Neurological:  Positive for dizziness. Negative for headaches.       Objective:   Physical Exam Constitutional:      Appearance: Normal appearance. She is not ill-appearing.  HENT:     Right Ear: Tympanic membrane, ear canal and external ear normal.     Left Ear: Ear canal and external ear normal.     Ears:     Comments: The left TM has a serous effusion behind it     Nose: Nose normal.     Mouth/Throat:     Pharynx: Oropharynx is clear.  Eyes:     Conjunctiva/sclera: Conjunctivae normal.  Pulmonary:     Effort: Pulmonary effort is normal.     Breath sounds: Normal breath sounds.  Lymphadenopathy:     Cervical: No cervical adenopathy.  Neurological:     General: No focal deficit present.     Mental Status: She is alert and oriented to person, place, and time.           Assessment & Plan:  She has a sinusitis which is causing the ear pain and the vertigo. Treat with 10 days of Doxycycline. Continue the daily antihistamine and add Flonase daily. Use meclizine as needed. She will drink plenty of fluids.  Alysia Penna, MD

## 2021-12-15 DIAGNOSIS — R87810 Cervical high risk human papillomavirus (HPV) DNA test positive: Secondary | ICD-10-CM | POA: Diagnosis not present

## 2021-12-15 DIAGNOSIS — Z1151 Encounter for screening for human papillomavirus (HPV): Secondary | ICD-10-CM | POA: Diagnosis not present

## 2021-12-15 DIAGNOSIS — Z124 Encounter for screening for malignant neoplasm of cervix: Secondary | ICD-10-CM | POA: Diagnosis not present

## 2021-12-17 ENCOUNTER — Other Ambulatory Visit: Payer: Self-pay | Admitting: Obstetrics and Gynecology

## 2021-12-17 DIAGNOSIS — N62 Hypertrophy of breast: Secondary | ICD-10-CM

## 2021-12-17 LAB — HM PAP SMEAR
HM Pap smear: NEGATIVE
HPV, high-risk: NEGATIVE

## 2022-01-28 ENCOUNTER — Ambulatory Visit
Admission: RE | Admit: 2022-01-28 | Discharge: 2022-01-28 | Disposition: A | Discharge status: Home / Self Care | Payer: BC Managed Care – PPO | Source: Ambulatory Visit | Attending: Obstetrics and Gynecology | Admitting: Obstetrics and Gynecology

## 2022-01-28 DIAGNOSIS — N62 Hypertrophy of breast: Secondary | ICD-10-CM

## 2022-01-28 DIAGNOSIS — Z1239 Encounter for other screening for malignant neoplasm of breast: Secondary | ICD-10-CM | POA: Diagnosis not present

## 2022-01-28 MED ORDER — GADOPICLENOL 0.5 MMOL/ML IV SOLN
7.0000 mL | Freq: Once | INTRAVENOUS | Status: AC | PRN
  Administered 2022-01-28: 7 mL via INTRAVENOUS

## 2022-04-12 DIAGNOSIS — H66001 Acute suppurative otitis media without spontaneous rupture of ear drum, right ear: Secondary | ICD-10-CM | POA: Diagnosis not present

## 2022-04-16 ENCOUNTER — Ambulatory Visit: Payer: BC Managed Care – PPO | Admitting: Nurse Practitioner

## 2022-04-16 ENCOUNTER — Encounter: Payer: Self-pay | Admitting: Nurse Practitioner

## 2022-04-16 VITALS — BP 120/74 | HR 70 | Temp 98.7°F | Resp 16 | Ht 63.0 in | Wt 148.0 lb

## 2022-04-16 DIAGNOSIS — H6690 Otitis media, unspecified, unspecified ear: Secondary | ICD-10-CM | POA: Diagnosis not present

## 2022-04-16 MED ORDER — DOXYCYCLINE HYCLATE 100 MG PO TABS: 100.0000 mg | ORAL_TABLET | Freq: Two times a day (BID) | ORAL | 0 refills | Status: DC

## 2022-04-16 MED ORDER — PREDNISONE 10 MG PO TABS: ORAL_TABLET | ORAL | 0 refills | Status: DC

## 2022-04-16 NOTE — Patient Instructions (Signed)
It was great to see you!  Start prednisone taper, 6 tablets today, 5 tomorrow, 4 the next, then keep decreasing by 1 every day until gone. Take this in the morning with food.  Start doxycycline twice a day for 7 days with food.   Start flonase daily  Take allegra or zyrtec daily.   You can still take sudaphed as needed.   Let's follow-up if your symptoms worsen or don't improve  Take care,  Vance Peper, NP

## 2022-04-16 NOTE — Progress Notes (Signed)
Acute Office Visit  Subjective:     Patient ID: Jamie Avila, female    DOB: 1960-02-04, 62 y.o.   MRN: [id number]  Chief Complaint  Patient presents with   Ear Fullness    Right ear is stopped up     HPI Patient is in today for fullness in her right ear. She went to urgent care on Sunday and was given a z-pak.   EAR CLOGGED  Duration: weeks Involved ear(s):  "right Sensation of feeling clogged/plugged: yes Decreased/muffled hearing:yes Ear pain:  mild Fever: no Otorrhea: no Hearing loss: yes Upper respiratory infection symptoms: yes Using Q-Tips: yes Status: stable History of cerumenosis: no Treatments attempted:  z pak, sudaphed, allegra, zyrtec, mucinex   ROS See pertinent positives and negatives per HPI.     Objective:    BP 120/74 (BP Location: Left Arm, Patient Position: Sitting, Cuff Size: Normal)   Pulse 70   Temp 98.7 F (37.1 C) (Oral)   Resp 16   Ht 5\' 3"  (1.6 m)   Wt 148 lb (67.1 kg)   SpO2 98%   BMI 26.22 kg/m    Physical Exam Vitals and nursing note reviewed.  Constitutional:      General: She is not in acute distress.    Appearance: Normal appearance.  HENT:     Head: Normocephalic.     Right Ear: Ear canal and external ear normal. A middle ear effusion is present. Tympanic membrane is erythematous.     Left Ear: Tympanic membrane, ear canal and external ear normal.  Eyes:     Conjunctiva/sclera: Conjunctivae normal.  Cardiovascular:     Rate and Rhythm: Normal rate and regular rhythm.     Pulses: Normal pulses.     Heart sounds: Normal heart sounds.  Pulmonary:     Effort: Pulmonary effort is normal.     Breath sounds: Normal breath sounds.  Musculoskeletal:     Cervical back: Normal range of motion and neck supple. No tenderness.  Lymphadenopathy:     Cervical: No cervical adenopathy.  Skin:    General: Skin is warm.  Neurological:     General: No focal deficit present.     Mental Status: She is alert and oriented  to person, place, and time.  Psychiatric:        Mood and Affect: Mood normal.        Behavior: Behavior normal.        Thought Content: Thought content normal.        Judgment: Judgment normal.     No results found for any visits on 04/16/22.      Assessment & Plan:   Problem List Items Addressed This Visit   None Visit Diagnoses     Acute otitis media, unspecified otitis media type    -  Primary   Ongoing otitis media with effusion. Will treat with prednisone taper and doxycycline 100mg  BID x7 days. Start flonase daily and allegra or zyrtec.   Relevant Medications   doxycycline (VIBRA-TABS) 100 MG tablet       Meds ordered this encounter  Medications   predniSONE (DELTASONE) 10 MG tablet    Sig: Take 6 tablets today, then 5 tablets tomorrow, then decrease by 1 tablet every day until gone    Dispense:  21 tablet    Refill:  0   doxycycline (VIBRA-TABS) 100 MG tablet    Sig: Take 1 tablet (100 mg total) by mouth 2 (two) times daily.  Dispense:  14 tablet    Refill:  0    Return if symptoms worsen or fail to improve.  Charyl Dancer, NP

## 2022-04-27 DIAGNOSIS — H903 Sensorineural hearing loss, bilateral: Secondary | ICD-10-CM | POA: Diagnosis not present

## 2022-04-29 DIAGNOSIS — H9121 Sudden idiopathic hearing loss, right ear: Secondary | ICD-10-CM | POA: Diagnosis not present

## 2022-05-04 ENCOUNTER — Other Ambulatory Visit: Payer: Self-pay | Admitting: Otolaryngology

## 2022-05-04 DIAGNOSIS — H9121 Sudden idiopathic hearing loss, right ear: Secondary | ICD-10-CM

## 2022-05-06 DIAGNOSIS — H9121 Sudden idiopathic hearing loss, right ear: Secondary | ICD-10-CM | POA: Diagnosis not present

## 2022-05-07 ENCOUNTER — Encounter: Payer: BC Managed Care – PPO | Admitting: Internal Medicine

## 2022-05-19 DIAGNOSIS — H8111 Benign paroxysmal vertigo, right ear: Secondary | ICD-10-CM | POA: Diagnosis not present

## 2022-05-19 DIAGNOSIS — H9041 Sensorineural hearing loss, unilateral, right ear, with unrestricted hearing on the contralateral side: Secondary | ICD-10-CM | POA: Diagnosis not present

## 2022-05-19 DIAGNOSIS — H9121 Sudden idiopathic hearing loss, right ear: Secondary | ICD-10-CM | POA: Diagnosis not present

## 2022-07-02 ENCOUNTER — Encounter: Payer: BC Managed Care – PPO | Admitting: Internal Medicine

## 2022-07-08 ENCOUNTER — Ambulatory Visit (INDEPENDENT_AMBULATORY_CARE_PROVIDER_SITE_OTHER): Payer: BC Managed Care – PPO | Admitting: Internal Medicine

## 2022-07-08 ENCOUNTER — Encounter: Payer: Self-pay | Admitting: Internal Medicine

## 2022-07-08 VITALS — BP 110/80 | HR 70 | Temp 97.6°F | Ht 64.0 in | Wt 146.1 lb

## 2022-07-08 DIAGNOSIS — Z0000 Encounter for general adult medical examination without abnormal findings: Secondary | ICD-10-CM | POA: Diagnosis not present

## 2022-07-08 DIAGNOSIS — Z114 Encounter for screening for human immunodeficiency virus [HIV]: Secondary | ICD-10-CM

## 2022-07-08 DIAGNOSIS — Z8279 Family history of other congenital malformations, deformations and chromosomal abnormalities: Secondary | ICD-10-CM | POA: Diagnosis not present

## 2022-07-08 DIAGNOSIS — R42 Dizziness and giddiness: Secondary | ICD-10-CM | POA: Diagnosis not present

## 2022-07-08 DIAGNOSIS — E782 Mixed hyperlipidemia: Secondary | ICD-10-CM

## 2022-07-08 DIAGNOSIS — Z1159 Encounter for screening for other viral diseases: Secondary | ICD-10-CM

## 2022-07-08 DIAGNOSIS — E559 Vitamin D deficiency, unspecified: Secondary | ICD-10-CM

## 2022-07-08 DIAGNOSIS — H6991 Unspecified Eustachian tube disorder, right ear: Secondary | ICD-10-CM | POA: Diagnosis not present

## 2022-07-08 LAB — COMPREHENSIVE METABOLIC PANEL
ALT: 19 U/L (ref 0–35)
AST: 16 U/L (ref 0–37)
Albumin: 4.5 g/dL (ref 3.5–5.2)
Alkaline Phosphatase: 118 U/L — ABNORMAL HIGH (ref 39–117)
BUN: 22 mg/dL (ref 6–23)
CO2: 26 mEq/L (ref 19–32)
Calcium: 9.7 mg/dL (ref 8.4–10.5)
Chloride: 103 mEq/L (ref 96–112)
Creatinine, Ser: 0.82 mg/dL (ref 0.40–1.20)
GFR: 76.74 mL/min (ref >60.00)
Glucose, Bld: 87 mg/dL (ref 70–99)
Potassium: 4.3 mEq/L (ref 3.5–5.1)
Sodium: 140 mEq/L (ref 135–145)
Total Bilirubin: 0.5 mg/dL (ref 0.2–1.2)
Total Protein: 7.8 g/dL (ref 6.0–8.3)

## 2022-07-08 LAB — CBC WITH DIFFERENTIAL/PLATELET
Basophils Absolute: 0.1 10*3/uL (ref 0.0–0.1)
Basophils Relative: 2 % (ref 0.0–3.0)
Eosinophils Absolute: 0.1 10*3/uL (ref 0.0–0.7)
Eosinophils Relative: 1.8 % (ref 0.0–5.0)
HCT: 44.6 % (ref 36.0–46.0)
Hemoglobin: 14.8 g/dL (ref 12.0–15.0)
Lymphocytes Relative: 39.8 % (ref 12.0–46.0)
Lymphs Abs: 1.8 10*3/uL (ref 0.7–4.0)
MCHC: 33.1 g/dL (ref 30.0–36.0)
MCV: 92.3 fl (ref 78.0–100.0)
Monocytes Absolute: 0.4 10*3/uL (ref 0.1–1.0)
Monocytes Relative: 9.6 % (ref 3.0–12.0)
Neutro Abs: 2.2 10*3/uL (ref 1.4–7.7)
Neutrophils Relative %: 46.8 % (ref 43.0–77.0)
Platelets: 249 10*3/uL (ref 150.0–400.0)
RBC: 4.84 Mil/uL (ref 3.87–5.11)
RDW: 13.6 % (ref 11.5–15.5)
WBC: 4.6 10*3/uL (ref 4.0–10.5)

## 2022-07-08 LAB — LIPID PANEL
Cholesterol: 249 mg/dL — ABNORMAL HIGH (ref 0–200)
HDL: 41.7 mg/dL (ref >39.00)
Total CHOL/HDL Ratio: 6
Triglycerides: 508 mg/dL — ABNORMAL HIGH (ref 0.0–149.0)

## 2022-07-08 LAB — TSH: TSH: 2.82 u[IU]/mL (ref 0.35–5.50)

## 2022-07-08 LAB — LDL CHOLESTEROL, DIRECT: Direct LDL: 110 mg/dL

## 2022-07-08 LAB — VITAMIN D 25 HYDROXY (VIT D DEFICIENCY, FRACTURES): VITD: 21.88 ng/mL — ABNORMAL LOW (ref 30.00–100.00)

## 2022-07-08 LAB — VITAMIN B12: Vitamin B-12: 291 pg/mL (ref 211–911)

## 2022-07-08 NOTE — Progress Notes (Signed)
Established Patient Office Visit     CC/Reason for Visit: Annual preventive exam and discuss some acute concerns  HPI: Jamie Avila is a 62 y.o. female who is coming in today for the above mentioned reasons. Past Medical History is significant for: Hyperlipidemia not on medication, vertigo, breast hyperplasia on a heightened screening protocol, previous Pap with high risk HPV.  She follows with GYN routinely.  She has had some hearing loss of the right ear and several ear infections over the spring.  She visited an ENT and was given steroid injections in her right ear.  Hearing loss has improved.  She has some significant right BPPV.  She states that when she lays down and turns her head to the right she will have some symptoms for a few seconds.  Her son and her mother have a bicuspid aortic valve she has no symptoms but is requesting echocardiogram to follow.   Past Medical/Surgical History: Past Medical History:  Diagnosis Date   GERD (gastroesophageal reflux disease)    Left breast mass    Ovarian cyst 1982   Peroneal tendinitis, left 05/26/2011   Proximal humerus fracture 2016   Right knee pain 09/04/2010   Sacroiliac joint pain 09/22/2011   Pain localizes directly over SIJ SIJ movement seemed OK    Shoulder joint dislocation 10/16/2010   She had a posterior glenohumeral joint dislocation on the right and a partial a.c. joint dislocation on the left from a fall while hiking     Past Surgical History:  Procedure Laterality Date   BREAST LUMPECTOMY WITH RADIOACTIVE SEED LOCALIZATION Left 08/17/2018   Procedure: LEFT BREAST LUMPECTOMY WITH RADIOACTIVE SEED LOCALIZATION;  Surgeon: Manus Rudd, MD;  Location: [facility];  Service: General;  Laterality: Left;   COLONOSCOPY  2012   repeat 2022   HUMERUS FRACTURE SURGERY  2016   HUMERUS FRACTURE SURGERY Right    OVARIAN CYST REMOVAL      Social History:  reports that she has never smoked. She has never  used smokeless tobacco. She reports current alcohol use. She reports that she does not use drugs.  Allergies: Allergies  Allergen Reactions   Penicillins Rash    Family History:  Family History  Problem Relation Age of Onset   High Cholesterol Mother    Osteoporosis Mother    COPD Mother    Arthritis Mother        rheumatoid in hands; mild   Hearing loss Mother    Colon polyps Father    High Cholesterol Father    Arthritis Father        osteo   Osteoporosis Father    CAD Father    Heart disease Maternal Grandmother        CHF; worsened after hip fracture   Early death Maternal Grandfather        uncertain cause   Heart attack Paternal Grandmother 1   Heart disease Paternal Grandmother    Early death Paternal Grandfather 54       ?valve issue   Heart attack Paternal Grandfather    Heart disease Paternal Grandfather    Breast cancer Neg Hx    Colon cancer Neg Hx    Esophageal cancer Neg Hx    Rectal cancer Neg Hx    Stomach cancer Neg Hx      Current Outpatient Medications:    CALCIUM PO, Take 600 mg by mouth daily., Disp: , Rfl:    Omega-3 Fatty  Acids (FISH OIL PO), Take by mouth., Disp: , Rfl:    Probiotic Product (PROBIOTIC PO), Take by mouth., Disp: , Rfl:    TURMERIC PO, Take by mouth daily., Disp: , Rfl:    VITAMIN D PO, Take by mouth daily., Disp: , Rfl:   Review of Systems:  Negative unless indicated in HPI.   Physical Exam: Vitals:   07/08/22 0901  BP: 110/80  Pulse: 70  Temp: 97.6 F (36.4 C)  TempSrc: Oral  SpO2: 97%  Weight: 146 lb 1.6 oz (66.3 kg)  Height: 5\' 4"  (1.626 m)    Body mass index is 25.08 kg/m.   Physical Exam Vitals reviewed.  Constitutional:      General: She is not in acute distress.    Appearance: Normal appearance. She is not ill-appearing, toxic-appearing or diaphoretic.  HENT:     Head: Normocephalic.     Right Ear: Tympanic membrane, ear canal and external ear normal. There is no impacted cerumen.     Left  Ear: Tympanic membrane, ear canal and external ear normal. There is no impacted cerumen.     Nose: Nose normal.     Mouth/Throat:     Mouth: Mucous membranes are moist.     Pharynx: Oropharynx is clear. No oropharyngeal exudate or posterior oropharyngeal erythema.  Eyes:     General: No scleral icterus.       Right eye: No discharge.        Left eye: No discharge.     Conjunctiva/sclera: Conjunctivae normal.     Pupils: Pupils are equal, round, and reactive to light.  Neck:     Vascular: No carotid bruit.  Cardiovascular:     Rate and Rhythm: Normal rate and regular rhythm.     Pulses: Normal pulses.     Heart sounds: Normal heart sounds.  Pulmonary:     Effort: Pulmonary effort is normal. No respiratory distress.     Breath sounds: Normal breath sounds.  Abdominal:     General: Abdomen is flat. Bowel sounds are normal.     Palpations: Abdomen is soft.  Musculoskeletal:        General: Normal range of motion.     Cervical back: Normal range of motion.  Skin:    General: Skin is warm and dry.  Neurological:     General: No focal deficit present.     Mental Status: She is alert and oriented to person, place, and time. Mental status is at baseline.  Psychiatric:        Mood and Affect: Mood normal.        Behavior: Behavior normal.        Thought Content: Thought content normal.        Judgment: Judgment normal.     Flowsheet Row Office Visit from 07/08/2022 in [facility] [place]  PHQ-9 Total Score 0       Impression and Plan:  Encounter for preventive health examination  Family history of bicuspid aortic valve -     ECHOCARDIOGRAM COMPLETE; Future  Dysfunction of right eustachian tube -     Referral to Neuro Rehab  Mixed hyperlipidemia -     CBC with Differential/Platelet; Future -     Comprehensive metabolic panel; Future -     Lipid panel; Future -     TSH; Future -     Vitamin B12; Future  Vitamin D deficiency -     VITAMIN D 25  Hydroxy (Vit-D  Deficiency, Fractures); Future  Encounter for hepatitis C screening test for low risk patient -     Hepatitis C antibody; Future  Encounter for screening for HIV -     HIV Antibody (routine testing w rflx); Future  Vertigo -     Referral to Neuro Rehab   -Recommend routine eye and dental care. -Healthy lifestyle discussed in detail. -Labs to be updated today. -Prostate cancer screening: N/A Health Maintenance  Topic Date Due   HIV Screening  Never done   Hepatitis C Screening  Never done   COVID-19 Vaccine (8 - 2023-24 season) 09/19/2021   Flu Shot  08/20/2022   Mammogram  01/29/2024   Pap Smear  06/04/2024   DTaP/Tdap/Td vaccine (2 - Td or Tdap) 11/09/2027   Colon Cancer Screening  04/22/2028   Zoster (Shingles) Vaccine  Completed   HPV Vaccine  Aged Out    -Refer to neurorehab/vestibular PT for her positional vertigo. -Given her family history of bicuspid aortic valve will send for an echocardiogram. -For eustachian tube dysfunction have advised a combination of antihistamine with guaifenesin to see if it helps.    Chaya Jan, MD [facility] at [facility]

## 2022-07-08 NOTE — Therapy (Signed)
OUTPATIENT PHYSICAL THERAPY VESTIBULAR EVALUATION     Patient Name: Jamie Avila MRN: [id number] DOB:May 23, 1960, 62 y.o., female Today's Date: 07/09/2022  END OF SESSION:  PT End of Session - 07/09/22 1012     Visit Number 1    Number of Visits 9    Date for PT Re-Evaluation 08/06/22    Authorization Type BCBS    PT Start Time [phone]    PT Stop Time 1010    PT Time Calculation (min) 39 min    Activity Tolerance Patient tolerated treatment well    Behavior During Therapy WFL for tasks assessed/performed             Past Medical History:  Diagnosis Date   GERD (gastroesophageal reflux disease)    Left breast mass    Ovarian cyst 1982   Peroneal tendinitis, left 05/26/2011   Proximal humerus fracture 2016   Right knee pain 09/04/2010   Sacroiliac joint pain 09/22/2011   Pain localizes directly over SIJ SIJ movement seemed OK    Shoulder joint dislocation 10/16/2010   She had a posterior glenohumeral joint dislocation on the right and a partial a.c. joint dislocation on the left from a fall while hiking    Past Surgical History:  Procedure Laterality Date   BREAST LUMPECTOMY WITH RADIOACTIVE SEED LOCALIZATION Left 08/17/2018   Procedure: LEFT BREAST LUMPECTOMY WITH RADIOACTIVE SEED LOCALIZATION;  Surgeon: Manus Rudd, MD;  Location: [facility];  Service: General;  Laterality: Left;   COLONOSCOPY  2012   repeat 2022   HUMERUS FRACTURE SURGERY  2016   HUMERUS FRACTURE SURGERY Right    OVARIAN CYST REMOVAL     Patient Active Problem List   Diagnosis Date Noted   Metatarsalgia of right foot 02/19/2021   Intraductal papilloma of left breast 04/13/2019   Angio-edema 02/20/2019   Right lower quadrant abdominal pain 02/20/2019   Vitamin D deficiency 06/03/2009   Hyperlipidemia 06/03/2009    PCP: Philip Aspen, Limmie Patricia, MD  REFERRING PROVIDER: Philip Aspen, Limmie Patricia, MD   REFERRING DIAG: [id number] (ICD-10-CM) - Dysfunction of right eustachian  tube R42 (ICD-10-CM) - Vertigo  THERAPY DIAG:  BPPV (benign paroxysmal positional vertigo), bilateral  Dizziness and giddiness  ONSET DATE: March 2024  Rationale for Evaluation and Treatment: Rehabilitation  SUBJECTIVE:   SUBJECTIVE STATEMENT: In mid-March she had a virus that started with Laryngitis, 3 days later the R ear felt completely blocked. Vertigo started 2 weeks after that. Has been treated by ENT with steroid shots but still has aural pressure. Aural pressure and dizziness are better than at first onset. Dizziness worst when laying on the back with head turned to the R, getting out of bed sometimes. Denies head trauma, vision changes/double vision, hearing loss, migraines. Reports new tinnitus. She reports that she has sudden hearing loss at high pitch but with retest it had recovered. Reports she was treated with repositioning maneuver which did not help.     Pt accompanied by: self  PERTINENT HISTORY: GERD, humerus fx with surgery, SIJ pain, L breast lumpectomy  PAIN:  Are you having pain? No  PRECAUTIONS: None  WEIGHT BEARING RESTRICTIONS: No  FALLS: Has patient fallen in last 6 months? 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   PATIENT GOALS: improve dizziness   OBJECTIVE:  DIAGNOSTIC FINDINGS: none recent  COGNITION: Overall cognitive status: Within functional limits for tasks assessed   SENSATION: WFL  POSTURE:  No Significant postural limitations  GAIT: Gait pattern: WFL Assistive device utilized: None Level of assistance: Complete Independence  PATIENT SURVEYS:  FOTO 57, 66  VESTIBULAR ASSESSMENT:  GENERAL OBSERVATION: pt wears contacts (1 for distance, 1 for reading)  OCULOMOTOR EXAM:  Ocular Alignment: normal  Ocular ROM: No  Limitations  Spontaneous Nystagmus: absent  Gaze-Induced Nystagmus: absent  Smooth Pursuits: intact  Saccades: intact    VESTIBULAR - OCULAR REFLEX:   Slow VOR: Normal c/o "cracking" in L jaw  VOR Cancellation: Normal  Head-Impulse Test: HIT Right: slightly positive HIT Left: slightly positive     POSITIONAL TESTING:  Right Roll Test: negative Left Roll Test: 3 beats Downbeating R torsional  Right Dix-Hallpike: R upbeating torsional nystagmus ; Duration:10 sec Left Dix-Hallpike: L upbeating torsional nystagmus, then downbeating torsional a couple beats  Right Sidelying: R upbeating torsional nystagmus ; Duration: 15-20 sec Left Sidelying: negative; c/o "almost dizzy" upon laying down and sitting up   R DH retest: 1 beat nystagmus; no dizziness   VESTIBULAR TREATMENT:                                                                                                   DATE: 07/09/22    Canalith Repositioning:  Epley Right: Number of Reps: 1, Response to Treatment: symptoms improved, and Comment: negative on 2nd R DH  PATIENT EDUCATION: Education details: prognosis, POC, edu on BPPV and hypofunction risk factors and vestibular tx for it, answered pt's questions  Person educated: Patient Education method: Explanation, Demonstration, Tactile cues, and Verbal cues Education comprehension: verbalized understanding  HOME EXERCISE PROGRAM:  GOALS: Goals reviewed with patient? Yes  SHORT TERM GOALS: Target date: 07/23/2022  Patient to be independent with initial HEP. Baseline: HEP initiated Goal status: INITIAL    LONG TERM GOALS: Target date: 08/06/2022  Patient to be independent with advanced HEP. Baseline: Not yet initiated  Goal status: INITIAL  Patient to report resolution of dizziness. Baseline: - Goal status: INITIAL  Patient will report 0/10 dizziness with bed mobility.  Baseline: Symptomatic  Goal status: INITIAL  Patient to demonstrate mild-moderate sway  with M-CTSIB condition with eyes closed/foam surface in order to improve safety in environments with uneven surfaces and dim lighting. Baseline: NT Goal status: INITIAL  Patient to score at least 66 on FOTO in order to indicate improved functional outcomes.  Baseline: 57 Goal status: INITIAL   ASSESSMENT:  CLINICAL IMPRESSION:   Patient is a 62 y/o F presenting to OPPT with c/o dizziness for the past few months after a bout of laryngitis.. Dizziness worst when laying on the back with head turned to the R, getting out of bed sometimes. Denies head trauma, vision changes/double vision, hearing loss, migraines. Reports new tinnitus. Exam today revealed slightly positive B HIT and B posterior BPPV. Patient was treated with R Epley x1, with retest negative and asymptomatic. Would benefit from skilled PT services 1-2x/week for 4 weeks to address aforementioned impairments in  order to optimize level of function.    OBJECTIVE IMPAIRMENTS: dizziness.   ACTIVITY LIMITATIONS: sleeping and transfers  PARTICIPATION LIMITATIONS: yard work  PERSONAL FACTORS: Age, Past/current experiences, Time since onset of injury/illness/exacerbation, and 3+ comorbidities: GERD, humerus fx with surgery, SIJ pain, L breast lumpectomy  are also affecting patient's functional outcome.   REHAB POTENTIAL: Good  CLINICAL DECISION MAKING: Evolving/moderate complexity  EVALUATION COMPLEXITY: Moderate   PLAN:  PT FREQUENCY: 1-2x/week  PT DURATION: 4 weeks  PLANNED INTERVENTIONS: Therapeutic exercises, Therapeutic activity, Neuromuscular re-education, Balance training, Gait training, Patient/Family education, Self Care, Joint mobilization, Stair training, Vestibular training, Canalith repositioning, Aquatic Therapy, Dry Needling, Electrical stimulation, Cryotherapy, Moist heat, Taping, Manual therapy, and Re-evaluation  PLAN FOR NEXT SESSION: MCTSIB, retest positional testing and treat   Anette Guarneri, PT,  DPT 07/09/22 11:26 AM  [facility] at [facility] [address] [place], [place] [address] Phone # [phone] Fax # [phone]

## 2022-07-09 ENCOUNTER — Encounter: Payer: Self-pay | Admitting: Physical Therapy

## 2022-07-09 ENCOUNTER — Telehealth (HOSPITAL_BASED_OUTPATIENT_CLINIC_OR_DEPARTMENT_OTHER): Payer: Self-pay | Admitting: *Deleted

## 2022-07-09 ENCOUNTER — Other Ambulatory Visit: Payer: Self-pay

## 2022-07-09 ENCOUNTER — Ambulatory Visit: Payer: BC Managed Care – PPO | Attending: Internal Medicine | Admitting: Physical Therapy

## 2022-07-09 ENCOUNTER — Other Ambulatory Visit: Payer: Self-pay | Admitting: Internal Medicine

## 2022-07-09 DIAGNOSIS — H8113 Benign paroxysmal vertigo, bilateral: Secondary | ICD-10-CM | POA: Diagnosis not present

## 2022-07-09 DIAGNOSIS — E559 Vitamin D deficiency, unspecified: Secondary | ICD-10-CM

## 2022-07-09 DIAGNOSIS — R42 Dizziness and giddiness: Secondary | ICD-10-CM | POA: Insufficient documentation

## 2022-07-09 LAB — HEPATITIS C ANTIBODY: Hepatitis C Ab: NONREACTIVE

## 2022-07-09 LAB — HIV ANTIBODY (ROUTINE TESTING W REFLEX): HIV 1&2 Ab, 4th Generation: NONREACTIVE

## 2022-07-09 MED ORDER — VITAMIN D (ERGOCALCIFEROL) 1.25 MG (50000 UNIT) PO CAPS
50000.0000 [IU] | ORAL_CAPSULE | ORAL | 0 refills | Status: AC
Start: 2022-07-09 — End: 2022-09-25

## 2022-07-09 NOTE — Telephone Encounter (Signed)
Left message for patient to call and discuss scheduling the Echocardiogram ordered by Dr. Philip Aspen

## 2022-07-10 NOTE — Therapy (Signed)
OUTPATIENT PHYSICAL THERAPY VESTIBULAR TREATMENT     Patient Name: Jamie Avila MRN: [id number] DOB:May 22, 1960, 62 y.o., female Today's Date: 07/13/2022  END OF SESSION:  PT End of Session - 07/13/22 1108     Visit Number 2    Number of Visits 9    Date for PT Re-Evaluation 08/06/22    Authorization Type BCBS    PT Start Time [phone]    PT Stop Time 1022    PT Time Calculation (min) 46 min    Activity Tolerance Patient tolerated treatment well    Behavior During Therapy WFL for tasks assessed/performed              Past Medical History:  Diagnosis Date   GERD (gastroesophageal reflux disease)    Left breast mass    Ovarian cyst 1982   Peroneal tendinitis, left 05/26/2011   Proximal humerus fracture 2016   Right knee pain 09/04/2010   Sacroiliac joint pain 09/22/2011   Pain localizes directly over SIJ SIJ movement seemed OK    Shoulder joint dislocation 10/16/2010   She had a posterior glenohumeral joint dislocation on the right and a partial a.c. joint dislocation on the left from a fall while hiking    Past Surgical History:  Procedure Laterality Date   BREAST LUMPECTOMY WITH RADIOACTIVE SEED LOCALIZATION Left 08/17/2018   Procedure: LEFT BREAST LUMPECTOMY WITH RADIOACTIVE SEED LOCALIZATION;  Surgeon: Manus Rudd, MD;  Location: [facility];  Service: General;  Laterality: Left;   COLONOSCOPY  2012   repeat 2022   HUMERUS FRACTURE SURGERY  2016   HUMERUS FRACTURE SURGERY Right    OVARIAN CYST REMOVAL     Patient Active Problem List   Diagnosis Date Noted   Metatarsalgia of right foot 02/19/2021   Intraductal papilloma of left breast 04/13/2019   Angio-edema 02/20/2019   Right lower quadrant abdominal pain 02/20/2019   Vitamin D deficiency 06/03/2009   Hyperlipidemia 06/03/2009    PCP: Philip Aspen, Limmie Patricia, MD  REFERRING PROVIDER: Philip Aspen, Limmie Patricia, MD   REFERRING DIAG: [phone] (ICD-10-CM) - Dysfunction of right  eustachian tube R42 (ICD-10-CM) - Vertigo  THERAPY DIAG:  BPPV (benign paroxysmal positional vertigo), bilateral  Dizziness and giddiness  ONSET DATE: March 2024  Rationale for Evaluation and Treatment: Rehabilitation  SUBJECTIVE:   SUBJECTIVE STATEMENT: Reports that feels that her dizziness is worse, particularly felt it when rolling back and forth in bed. Reports ear pressure is worse and not sure if it is related.    Pt accompanied by: self  PERTINENT HISTORY: GERD, humerus fx with surgery, SIJ pain, L breast lumpectomy  PAIN:  Are you having pain? No  PRECAUTIONS: None  WEIGHT BEARING RESTRICTIONS: No  FALLS: Has patient fallen in last 6 months? 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 C/o dizziness upon sitting up   R roll negative  L roll Apogeotropic nystagmus lasting ~ 1 min  Gufoni for apogeotropic  nystagmus (R side first) Apogeotropic nystagmus (subtle)  L roll  Subtle Apogeotropic nystagmus lasting ~ 1 min  Kim maneuver starting on R side Vibration to R mastoid in position 1 and 4. Geotropic nystagmus in position 4. Tolerated well          HOME EXERCISE PROGRAM Last updated: 07/13/22 Access Code: [email or web address] URL: [email or web address] Date: 07/13/2022 Prepared by: [facility] - Outpatient  Rehab - [facility]  Exercises - Supine to Left Sidelying Vestibular Habituation  - 1 x daily - 5 x weekly - 2 sets - 3-5 reps - Supine to  Right Sidelying Vestibular Habituation  - 1 x daily - 5 x weekly - 2 sets - 3-5 reps - Brandt-Daroff Vestibular Exercise  - 1 x daily - 5 x weekly - 2 sets - 3-5 reps   PATIENT EDUCATION: Education details: edu on BPPV risk factors, answered pt's questions; HEP Person educated: Patient Education method: Programmer, multimedia, Demonstration, Tactile cues, Verbal cues, and Handouts Education comprehension: verbalized understanding and returned demonstration     Below measures were taken at time of initial evaluation unless otherwise specified:   DIAGNOSTIC FINDINGS: none recent  COGNITION: Overall cognitive status: Within functional limits for tasks assessed   SENSATION: WFL  POSTURE:  No Significant postural limitations  GAIT: Gait pattern: WFL Assistive device utilized: None Level of assistance: Complete Independence  PATIENT SURVEYS:  FOTO 57, 66  VESTIBULAR ASSESSMENT:  GENERAL OBSERVATION: pt wears contacts (1 for distance, 1 for reading)  OCULOMOTOR EXAM:  Ocular Alignment: normal  Ocular ROM: No Limitations  Spontaneous Nystagmus: absent  Gaze-Induced Nystagmus: absent  Smooth Pursuits: intact  Saccades: intact    VESTIBULAR - OCULAR REFLEX:   Slow VOR: Normal c/o "cracking" in L jaw  VOR Cancellation: Normal  Head-Impulse Test: HIT Right: slightly positive HIT Left: slightly positive     POSITIONAL TESTING:  Right Roll Test: negative Left Roll Test: 3 beats Downbeating R torsional  Right Dix-Hallpike: R upbeating torsional nystagmus ; Duration:10 sec Left Dix-Hallpike: L upbeating torsional nystagmus, then downbeating torsional a couple beats  Right Sidelying: R upbeating torsional nystagmus ; Duration: 15-20 sec Left Sidelying: negative; c/o "almost dizzy" upon laying down and sitting up   R DH retest: 1 beat nystagmus; no dizziness   VESTIBULAR TREATMENT:                                                                                                    DATE: 07/09/22    Canalith Repositioning:  Epley Right: Number of Reps: 1, Response to Treatment: symptoms improved, and Comment: negative on 2nd R DH  PATIENT EDUCATION: Education details: prognosis, POC, edu on BPPV and hypofunction risk factors and vestibular tx for it, answered pt's questions  Person educated: Patient Education method: Explanation, Demonstration, Tactile cues, and Verbal cues Education comprehension: verbalized understanding  HOME EXERCISE PROGRAM:  GOALS: Goals reviewed with patient? Yes  SHORT TERM GOALS: Target date: 07/23/2022  Patient to be independent with initial HEP. Baseline: HEP initiated Goal status: IN PROGRESS    LONG TERM GOALS: Target date: 08/06/2022  Patient  to be independent with advanced HEP. Baseline: Not yet initiated  Goal status: IN PROGRESS  Patient to report resolution of dizziness. Baseline: - Goal status: IN PROGRESS  Patient will report 0/10 dizziness with bed mobility.  Baseline: Symptomatic  Goal status: IN PROGRESS  Patient to demonstrate mild-moderate sway with M-CTSIB condition with eyes closed/foam surface in order to improve safety in environments with uneven surfaces and dim lighting. Baseline: NT Goal status: IN PROGRESS  Patient to score at least 66 on FOTO in order to indicate improved functional outcomes.  Baseline: 57 Goal status: IN PROGRESS   ASSESSMENT:  CLINICAL IMPRESSION: Patient arrived to session with report of increased dizziness and ear pressure. Positional testing revealed resolution of R BPPV, but still with L posterior canalithiasis. Patient tolerated L Epley x1. Subsequent retest revealed R horizontal Cupulolithiasis. Treated with Gufoni for apogeotropic nystagmus without full resolution, then Kim maneuver. Patient tolerated session well and reported understanding of HEP. No complaints upon leaving.     OBJECTIVE IMPAIRMENTS: dizziness.   ACTIVITY LIMITATIONS: sleeping and  transfers  PARTICIPATION LIMITATIONS: yard work  PERSONAL FACTORS: Age, Past/current experiences, Time since onset of injury/illness/exacerbation, and 3+ comorbidities: GERD, humerus fx with surgery, SIJ pain, L breast lumpectomy  are also affecting patient's functional outcome.   REHAB POTENTIAL: Good  CLINICAL DECISION MAKING: Evolving/moderate complexity  EVALUATION COMPLEXITY: Moderate   PLAN:  PT FREQUENCY: 1-2x/week  PT DURATION: 4 weeks  PLANNED INTERVENTIONS: Therapeutic exercises, Therapeutic activity, Neuromuscular re-education, Balance training, Gait training, Patient/Family education, Self Care, Joint mobilization, Stair training, Vestibular training, Canalith repositioning, Aquatic Therapy, Dry Needling, Electrical stimulation, Cryotherapy, Moist heat, Taping, Manual therapy, and Re-evaluation  PLAN FOR NEXT SESSION: MCTSIB, retest positional testing and treat   Anette Guarneri, PT, DPT 07/13/22 11:09 AM  [facility] at [facility] [address] [place], [place] [address] Phone # [phone] Fax # [phone]

## 2022-07-13 ENCOUNTER — Ambulatory Visit: Payer: BC Managed Care – PPO | Admitting: Physical Therapy

## 2022-07-13 ENCOUNTER — Encounter: Payer: Self-pay | Admitting: Physical Therapy

## 2022-07-13 DIAGNOSIS — R42 Dizziness and giddiness: Secondary | ICD-10-CM

## 2022-07-13 DIAGNOSIS — H8113 Benign paroxysmal vertigo, bilateral: Secondary | ICD-10-CM | POA: Diagnosis not present

## 2022-07-21 ENCOUNTER — Ambulatory Visit: Payer: BC Managed Care – PPO | Attending: Internal Medicine | Admitting: Physical Therapy

## 2022-08-11 DIAGNOSIS — Z6825 Body mass index (BMI) 25.0-25.9, adult: Secondary | ICD-10-CM | POA: Diagnosis not present

## 2022-08-11 DIAGNOSIS — Z1151 Encounter for screening for human papillomavirus (HPV): Secondary | ICD-10-CM | POA: Diagnosis not present

## 2022-08-11 DIAGNOSIS — Z01419 Encounter for gynecological examination (general) (routine) without abnormal findings: Secondary | ICD-10-CM | POA: Diagnosis not present

## 2022-08-11 DIAGNOSIS — Z1231 Encounter for screening mammogram for malignant neoplasm of breast: Secondary | ICD-10-CM | POA: Diagnosis not present

## 2022-08-11 DIAGNOSIS — Z124 Encounter for screening for malignant neoplasm of cervix: Secondary | ICD-10-CM | POA: Diagnosis not present

## 2022-08-11 DIAGNOSIS — Z13820 Encounter for screening for osteoporosis: Secondary | ICD-10-CM | POA: Diagnosis not present

## 2022-08-12 ENCOUNTER — Ambulatory Visit (INDEPENDENT_AMBULATORY_CARE_PROVIDER_SITE_OTHER): Payer: BC Managed Care – PPO

## 2022-08-12 DIAGNOSIS — Z8279 Family history of other congenital malformations, deformations and chromosomal abnormalities: Secondary | ICD-10-CM | POA: Diagnosis not present

## 2022-08-12 LAB — ECHOCARDIOGRAM COMPLETE
Area-P 1/2: 3.77 cm2
S' Lateral: 2.31 cm

## 2022-10-08 DIAGNOSIS — N83201 Unspecified ovarian cyst, right side: Secondary | ICD-10-CM | POA: Diagnosis not present

## 2022-10-15 ENCOUNTER — Telehealth: Payer: Self-pay | Admitting: *Deleted

## 2022-10-15 NOTE — Telephone Encounter (Signed)
LMOM for the patient to call the office back to schedule a new patient appt on 10/11

## 2022-10-28 ENCOUNTER — Encounter: Payer: Self-pay | Admitting: Psychiatry

## 2022-11-02 ENCOUNTER — Inpatient Hospital Stay: Payer: BC Managed Care – PPO | Attending: Psychiatry | Admitting: Psychiatry

## 2022-11-02 ENCOUNTER — Inpatient Hospital Stay: Payer: BC Managed Care – PPO

## 2022-11-02 ENCOUNTER — Encounter: Payer: Self-pay | Admitting: Psychiatry

## 2022-11-02 VITALS — BP 132/70 | HR 69 | Temp 98.1°F | Resp 20 | Wt 144.4 lb

## 2022-11-02 DIAGNOSIS — K219 Gastro-esophageal reflux disease without esophagitis: Secondary | ICD-10-CM | POA: Insufficient documentation

## 2022-11-02 DIAGNOSIS — N83209 Unspecified ovarian cyst, unspecified side: Secondary | ICD-10-CM

## 2022-11-02 DIAGNOSIS — N83201 Unspecified ovarian cyst, right side: Secondary | ICD-10-CM | POA: Insufficient documentation

## 2022-11-02 LAB — CEA (ACCESS): CEA (CHCC): 1.44 ng/mL (ref 0.00–5.00)

## 2022-11-02 NOTE — Progress Notes (Signed)
GYNECOLOGIC ONCOLOGY NEW PATIENT CONSULTATION  Date of Service: 11/02/2022 Referring Provider: Richardean Chimera, MD   ASSESSMENT AND PLAN: Jamie Avila is a 62 y.o. 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  We reviewed that the exact etiology of the pelvic mass is unclear, but could include a benign, borderline, or malignant process. At this time, review of prior 2021 CT and 2021 CA125 are overall reassuring. Report from clinic ultrasound reviewed, but discussed that I cannot view these images.  Recommend updated tumor markers with CEA, CA19-9, CA125. Also recommend repeat pelvic ultrasound for additional views. I will review these images when available.   Once work-up is complete, we will have patient return to review findings and recommendations.  We did briefly review today that surgical excision is the only way to make a definitive diagnosis.  But with additional workup I can better counsel patient on risks and benefits of surgical intervention versus observation.  Patient also with some questions today about concurrent hysterectomy.  At this time, we briefly reviewed that there is not necessarily a medical indication for hysterectomy but this could change if the mass were to result as a borderline or malignant process.  Will have patient return after lab work and imaging complete to discuss final plan.   A copy of this note was sent to the patient's referring provider.  Clide Cliff, MD Gynecologic Oncology   Medical Decision Making I personally spent  TOTAL 50 minutes face-to-face and non-face-to-face in the care of this patient, which includes all pre, intra, and post visit time on the date of service.   ------------  CC: Ovarian cysts  HISTORY OF PRESENT ILLNESS:  Jamie Avila is a 62 y.o. woman who is seen in consultation at the request of Richardean Chimera, MD for evaluation of ovarian cysts.  Patient followed up with her OB/GYN on  10/08/2022.  She has been followed for a history of right ovarian cyst.  CA125 in 2021 was normal at 9.6.  Most recently on ultrasound on 10/08/2022 she was noted to have a right ovary with multiple cystic areas measuring 5 x 4.1 x 5.1 cm, 1.9 x 2 cm and a cluster of smaller cyst 1.8 x 1.4 cm, 1.8 x 0.7 cm, 1.3 x 1.2 cm.  This cluster cyst contains increased blood flow.  Repeat CA125 was not performed at this time.  Today pt presents with her partner. She reports that her workup started with RLQ pain 3 years ago. She describes the pain as aching. The pain occurred in the fall and then went away. She has experienced the pain again since in the fall, but the pain is not year round. The patient denies abdominal bloating, early satiety, significant weight loss, change in bowel or bladder habits.    PAST MEDICAL HISTORY: Past Medical History:  Diagnosis Date   GERD (gastroesophageal reflux disease)    Left breast mass    Ovarian cyst 1982   Peroneal tendinitis, left 05/26/2011   Proximal humerus fracture 2016   Right knee pain 09/04/2010   Sacroiliac joint pain 09/22/2011   Pain localizes directly over SIJ SIJ movement seemed OK    Shoulder joint dislocation 10/16/2010   She had a posterior glenohumeral joint dislocation on the right and a partial a.c. joint dislocation on the left from a fall while hiking     PAST SURGICAL HISTORY: Past Surgical History:  Procedure Laterality Date   BREAST LUMPECTOMY WITH RADIOACTIVE SEED LOCALIZATION Left 08/17/2018  Procedure: LEFT BREAST LUMPECTOMY WITH RADIOACTIVE SEED LOCALIZATION;  Surgeon: Manus Rudd, MD;  Location: [facility];  Service: General;  Laterality: Left;   COLONOSCOPY  2012   repeat 2022   HUMERUS FRACTURE SURGERY  2016   HUMERUS FRACTURE SURGERY Right    OVARIAN CYST REMOVAL  12/1979   pt can't remember the side, maybe right side    OB/GYN HISTORY: OB History  Gravida Para Term Preterm AB Living  3 3 3     3   SAB IAB  Ectopic Multiple Live Births          3    # Outcome Date GA Lbr Len/2nd Weight Sex Type Anes PTL Lv  3 Term      Vag-Spont   LIV  2 Term      Vag-Spont   LIV  1 Term      Vag-Spont   LIV      Age at menarche: 16 Age at menopause: 89 Hx of HRT: no Hx of STI: no Last pap: 08/11/2022 normal History of abnormal pap smears: HR HPV + 05/2021, 11/2021 ASCUS negative HPV  SCREENING STUDIES:  Last mammogram: 07/2022 Last colonoscopy: 2022, 7 year follow-up  MEDICATIONS:  Current Outpatient Medications:    CALCIUM PO, Take 600 mg by mouth daily., Disp: , Rfl:    Omega-3 Fatty Acids (FISH OIL PO), Take by mouth., Disp: , Rfl:    Probiotic Product (PROBIOTIC PO), Take by mouth., Disp: , Rfl:    TURMERIC PO, Take by mouth daily., Disp: , Rfl:    VITAMIN D PO, Take by mouth daily., Disp: , Rfl:   ALLERGIES: Allergies  Allergen Reactions   Penicillins Rash    FAMILY HISTORY: Family History  Problem Relation Age of Onset   High Cholesterol Mother    Osteoporosis Mother    COPD Mother    Arthritis Mother        rheumatoid in hands; mild   Hearing loss Mother    Colon polyps Father    High Cholesterol Father    Arthritis Father        osteo   Osteoporosis Father    CAD Father    Heart disease Maternal Grandmother        CHF; worsened after hip fracture   Early death Maternal Grandfather        uncertain cause   Heart attack Paternal Grandmother 29   Heart disease Paternal Grandmother    Early death Paternal Grandfather 75       ?valve issue   Heart attack Paternal Grandfather    Heart disease Paternal Grandfather    Breast cancer Neg Hx    Colon cancer Neg Hx    Esophageal cancer Neg Hx    Rectal cancer Neg Hx    Stomach cancer Neg Hx    Cancer - Colon Neg Hx    Ovarian cancer Neg Hx    Endometrial cancer Neg Hx    Pancreatic cancer Neg Hx    Prostate cancer Neg Hx     SOCIAL HISTORY: Social History   Socioeconomic History   Marital status: Married    Spouse  name: Not on file   Number of children: Not on file   Years of education: Not on file   Highest education level: Not on file  Occupational History   Not on file  Tobacco Use   Smoking status: Never   Smokeless tobacco: Never  Vaping Use   Vaping status:  Never Used  Substance and Sexual Activity   Alcohol use: Yes    Comment: social   Drug use: Never   Sexual activity: Not on file  Other Topics Concern   Not on file  Social History Narrative   Not on file   Social Determinants of Health   Financial Resource Strain: Not on file  Food Insecurity: No Food Insecurity (10/28/2022)   Hunger Vital Sign    Worried About Running Out of Food in the Last Year: Never true    Ran Out of Food in the Last Year: Never true  Transportation Needs: No Transportation Needs (10/28/2022)   PRAPARE - Administrator, Civil Service (Medical): No    Lack of Transportation (Non-Medical): No  Physical Activity: Not on file  Stress: Not on file  Social Connections: Not on file  Intimate Partner Violence: Not on file    REVIEW OF SYSTEMS: New patient intake form was reviewed.  Complete 10-system review is negative except for the following: none  PHYSICAL EXAM: BP 132/70 (BP Location: Left Arm)   Pulse 69   Temp 98.1 F (36.7 C) (Oral)   Resp 20   Wt 144 lb 6.4 oz (65.5 kg)   SpO2 99%   BMI 24.79 kg/m  Constitutional: No acute distress. Neuro/Psych: Alert, oriented.  Head and Neck: Normocephalic, atraumatic. Neck symmetric without masses. Sclera anicteric.  Respiratory: Normal work of breathing. Clear to auscultation bilaterally. Cardiovascular: Regular rate and rhythm, no murmurs, rubs, or gallops. Abdomen: Normoactive bowel sounds. Soft, non-distended, non-tender to palpation. No masses appreciated. No evidence of hernia. No palpable fluid wave. Well healed infraumbilical midline incision Extremities: Grossly normal range of motion. Warm, well perfused. No edema  bilaterally. Skin: No rashes or lesions. Lymphatic: No cervical, supraclavicular, or inguinal adenopathy. Genitourinary: External genitalia without lesions. Urethral meatus without lesions or prolapse. On speculum exam, vagina and cervix without lesions. Bimanual exam reveals small mobile uterus. Normal left adnexa. Cystic mass palpated in the right adnexa, mobile. Exam chaperoned by Warner Mccreedy, NP   LABORATORY AND RADIOLOGIC DATA: Outside medical records were reviewed to synthesize the above history, along with the history and physical obtained during the visit.  Outside laboratory, and imaging reports were reviewed, with pertinent results below.  I personally reviewed the outside images.  WBC  Date Value Ref Range Status  07/08/2022 4.6 4.0 - 10.5 K/uL Final   Hemoglobin  Date Value Ref Range Status  07/08/2022 14.8 12.0 - 15.0 g/dL Final   HCT  Date Value Ref Range Status  07/08/2022 44.6 36.0 - 46.0 % Final   Platelets  Date Value Ref Range Status  07/08/2022 249.0 150.0 - 400.0 K/uL Final   Creatinine, Ser  Date Value Ref Range Status  07/08/2022 0.82 0.40 - 1.20 mg/dL Final   AST  Date Value Ref Range Status  07/08/2022 16 0 - 37 U/L Final   ALT  Date Value Ref Range Status  07/08/2022 19 0 - 35 U/L Final   CA125 (2021): 9.6  Pelvic ultrasound (10/08/22): Uterus with subserosal fibroids = 19 mm, 10 mm Right ovary with multiple cystic areas = 50 x 41 x 51 mm, 19 x 20 mm, and a cluster of small cysts = 18 x 14 mm, 18 x 7 mm, 13 x 12 mm, this cluster of cysts contains increased blood flow. Left ovary within normal limits. No adnexal masses or free fluid seen   CT abdomen/pelvis (03/03/19): CT ABDOMEN AND PELVIS WITH CONTRAST  TECHNIQUE: Multidetector CT imaging of the abdomen and pelvis was performed using the standard protocol following bolus administration of intravenous contrast.   CONTRAST:  ISOVUE-300 IOPAMIDOL (ISOVUE-300) INJECTION 61%    COMPARISON:  None.   FINDINGS: Lower chest: Minimal dependent atelectasis. Heart size normal. No pericardial or pleural effusion. Distal esophagus is unremarkable.   Hepatobiliary: Liver and gallbladder are unremarkable. No biliary ductal dilatation.   Pancreas: Negative.   Spleen: Negative.   Adrenals/Urinary Tract: Adrenal glands are unremarkable. Subcentimeter low-attenuation lesions in the kidneys are too small to characterize but statistically, cysts are likely. Ureters are decompressed. Bladder is grossly unremarkable.   Stomach/Bowel: Stomach, small bowel and colon are unremarkable. Appendix is not visualized.   Vascular/Lymphatic: Vascular structures are unremarkable. No pathologically enlarged lymph nodes.   Reproductive: Uterus is visualized and may contain fibroids. 3.7 cm low-density lesion in the right adnexa appears to arise from right ovary. No solid mass.   Other: Small umbilical hernia contains fat. Mesenteries and peritoneum are otherwise unremarkable. No free fluid.   Musculoskeletal: No worrisome lytic or sclerotic lesions.   IMPRESSION: 1. No acute findings. 2. Cystic right adnexal lesion, rising from the right ovary. Consider baseline pelvic ultrasound in further evaluation as malignancy cannot be definitively excluded.

## 2022-11-02 NOTE — Patient Instructions (Signed)
It was a pleasure to see you in clinic today. - Recommend repeat tumor markers today and pelvic ultrasound - Return visit planned for after workup complete. At that time we will discuss possible surgical options.  Thank you very much for allowing me to provide care for you today.  I appreciate your confidence in choosing our Gynecologic Oncology team at [facility].  If you have any questions about your visit today please call our office or send [place] a MyChart message and we will get back to you as soon as possible.

## 2022-11-03 LAB — CANCER ANTIGEN 19-9: CA 19-9: 37 U/mL — ABNORMAL HIGH (ref 0–35)

## 2022-11-03 LAB — CA 125: Cancer Antigen (CA) 125: 11.3 U/mL (ref 0.0–38.1)

## 2022-11-05 ENCOUNTER — Other Ambulatory Visit (HOSPITAL_COMMUNITY): Payer: BC Managed Care – PPO

## 2022-11-09 DIAGNOSIS — Z86018 Personal history of other benign neoplasm: Secondary | ICD-10-CM | POA: Diagnosis not present

## 2022-11-09 DIAGNOSIS — L821 Other seborrheic keratosis: Secondary | ICD-10-CM | POA: Diagnosis not present

## 2022-11-09 DIAGNOSIS — D229 Melanocytic nevi, unspecified: Secondary | ICD-10-CM | POA: Diagnosis not present

## 2022-11-09 DIAGNOSIS — L578 Other skin changes due to chronic exposure to nonionizing radiation: Secondary | ICD-10-CM | POA: Diagnosis not present

## 2022-11-10 ENCOUNTER — Ambulatory Visit (HOSPITAL_COMMUNITY)
Admission: RE | Admit: 2022-11-10 | Discharge: 2022-11-10 | Disposition: A | Discharge status: Home / Self Care | Payer: BC Managed Care – PPO | Source: Ambulatory Visit | Attending: Psychiatry | Admitting: Psychiatry

## 2022-11-10 DIAGNOSIS — D259 Leiomyoma of uterus, unspecified: Secondary | ICD-10-CM | POA: Diagnosis not present

## 2022-11-10 DIAGNOSIS — N83209 Unspecified ovarian cyst, unspecified side: Secondary | ICD-10-CM | POA: Insufficient documentation

## 2022-11-16 ENCOUNTER — Ambulatory Visit: Payer: BC Managed Care – PPO | Admitting: Psychiatry

## 2022-11-23 ENCOUNTER — Inpatient Hospital Stay (HOSPITAL_BASED_OUTPATIENT_CLINIC_OR_DEPARTMENT_OTHER): Payer: BC Managed Care – PPO | Admitting: Gynecologic Oncology

## 2022-11-23 ENCOUNTER — Inpatient Hospital Stay: Payer: BC Managed Care – PPO | Attending: Psychiatry | Admitting: Psychiatry

## 2022-11-23 ENCOUNTER — Encounter: Payer: Self-pay | Admitting: Psychiatry

## 2022-11-23 VITALS — BP 137/69 | HR 80 | Temp 98.6°F | Resp 20 | Wt 147.6 lb

## 2022-11-23 DIAGNOSIS — N83201 Unspecified ovarian cyst, right side: Secondary | ICD-10-CM | POA: Diagnosis not present

## 2022-11-23 DIAGNOSIS — N83209 Unspecified ovarian cyst, unspecified side: Secondary | ICD-10-CM

## 2022-11-23 DIAGNOSIS — R978 Other abnormal tumor markers: Secondary | ICD-10-CM | POA: Diagnosis not present

## 2022-11-23 DIAGNOSIS — Z7189 Other specified counseling: Secondary | ICD-10-CM

## 2022-11-23 MED ORDER — TRAMADOL HCL 50 MG PO TABS
50.0000 mg | ORAL_TABLET | Freq: Four times a day (QID) | ORAL | 0 refills | Status: DC | PRN
Start: 2022-11-23 — End: 2023-07-12

## 2022-11-23 MED ORDER — SENNOSIDES-DOCUSATE SODIUM 8.6-50 MG PO TABS
2.0000 | ORAL_TABLET | Freq: Every day | ORAL | 0 refills | Status: DC
Start: 2022-11-23 — End: 2023-07-12

## 2022-11-23 NOTE — Patient Instructions (Addendum)
Preparing for your Surgery  Plan for surgery on 12/29/2022 with Dr. Clide Cliff at [facility]. You will be scheduled for robotic assisted laparoscopic bilateral salpingo-oophorectomy (removal of both ovaries and fallopian tubes), possible robotic assisted laparoscopic total hysterectomy (removal of the uterus and cervix), possible staging if a precancer or cancer is seen.   Pre-operative Testing -You will receive a phone call from presurgical testing at [facility] to arrange for a pre-operative appointment and lab work.  -Bring your insurance card, copy of an advanced directive if applicable, medication list  -At that visit, you will be asked to sign a consent for a possible blood transfusion in case a transfusion becomes necessary during surgery.  The need for a blood transfusion is rare but having consent is a necessary part of your care.     -You should not be taking blood thinners or aspirin at least ten days prior to surgery unless instructed by your surgeon.  -Do not take supplements such as fish oil (omega 3), red yeast rice, turmeric before your surgery. You want to avoid medications with aspirin in them including headache powders such as BC or Goody's), Excedrin migraine. STOP AT LEAST 10 DAYS BEFORE SURGERY.  Day Before Surgery at Home -You will be asked to take in a light diet the day before surgery. You will be advised you can have clear liquids up until 3 hours before your surgery.    Eat a light diet the day before surgery.  Examples including soups, broths, toast, yogurt, mashed potatoes.  AVOID GAS PRODUCING FOODS AND BEVERAGES. Things to avoid include carbonated beverages (fizzy beverages, sodas), raw fruits and raw vegetables (uncooked), or beans.   If your bowels are filled with gas, your surgeon will have difficulty visualizing your pelvic organs which increases your surgical risks.  Your role in recovery Your role is to become active as soon as  directed by your doctor, while still giving yourself time to heal.  Rest when you feel tired. You will be asked to do the following in order to speed your recovery:  - Cough and breathe deeply. This helps to clear and expand your lungs and can prevent pneumonia after surgery.  - STAY ACTIVE WHEN YOU GET HOME. Do mild physical activity. Walking or moving your legs help your circulation and body functions return to normal. Do not try to get up or walk alone the first time after surgery.   -If you develop swelling on one leg or the other, pain in the back of your leg, redness/warmth in one of your legs, please call the office or go to the Emergency Room to have a doppler to rule out a blood clot. For shortness of breath, chest pain-seek care in the Emergency Room as soon as possible. - Actively manage your pain. Managing your pain lets you move in comfort. We will ask you to rate your pain on a scale of zero to 10. It is your responsibility to tell your doctor or nurse where and how much you hurt so your pain can be treated.  Special Considerations -If you are diabetic, you may be placed on insulin after surgery to have closer control over your blood sugars to promote healing and recovery.  This does not mean that you will be discharged on insulin.  If applicable, your oral antidiabetics will be resumed when you are tolerating a solid diet.  -Your final pathology results from surgery should be available around one week after surgery and  the results will be relayed to you when available.  -Dr. Antionette Char is the surgeon that assists your GYN Oncologist with surgery.  If you end up staying the night, the next day after your surgery you will either see Dr. Pricilla Holm, Dr. [name], or Dr. Antionette Char.  -FMLA forms can be faxed to [phone] and please allow 5-7 business days for completion.  Pain Management After Surgery -You will be prescribed your pain medication and bowel regimen medications  before surgery so that you can have these available when you are discharged from the hospital. The pain medication is for use ONLY AFTER surgery and a new prescription will not be given.   -Make sure that you have Tylenol and Ibuprofen IF YOU ARE ABLE TO TAKE THESE MEDICATIONS at home to use on a regular basis after surgery for pain control. We recommend alternating the medications every hour to six hours since they work differently and are processed in the body differently for pain relief.  -Review the attached handout on narcotic use and their risks and side effects.   Bowel Regimen -You will be prescribed Sennakot-S to take nightly to prevent constipation especially if you are taking the narcotic pain medication intermittently.  It is important to prevent constipation and drink adequate amounts of liquids. You can stop taking this medication when you are not taking pain medication and you are back on your normal bowel routine.  Risks of Surgery Risks of surgery are low but include bleeding, infection, damage to surrounding structures, re-operation, blood clots, and very rarely death.   Blood Transfusion Information (For the consent to be signed before surgery)  We will be checking your blood type before surgery so in case of emergencies, we will know what type of blood you would need.                                            WHAT IS A BLOOD TRANSFUSION?  A transfusion is the replacement of blood or some of its parts. Blood is made up of multiple cells which provide different functions. Red blood cells carry oxygen and are used for blood loss replacement. White blood cells fight against infection. Platelets control bleeding. Plasma helps clot blood. Other blood products are available for specialized needs, such as hemophilia or other clotting disorders. BEFORE THE TRANSFUSION  Who gives blood for transfusions?  You may be able to donate blood to be used at a later date on yourself  (autologous donation). Relatives can be asked to donate blood. This is generally not any safer than if you have received blood from a stranger. The same precautions are taken to ensure safety when a relative's blood is donated. Healthy volunteers who are fully evaluated to make sure their blood is safe. This is blood bank blood. Transfusion therapy is the safest it has ever been in the practice of medicine. Before blood is taken from a donor, a complete history is taken to make sure that person has no history of diseases nor engages in risky social behavior (examples are intravenous drug use or sexual activity with multiple partners). The donor's travel history is screened to minimize risk of transmitting infections, such as malaria. The donated blood is tested for signs of infectious diseases, such as HIV and hepatitis. The blood is then tested to be sure it is compatible with you in order to  minimize the chance of a transfusion reaction. If you or a relative donates blood, this is often done in anticipation of surgery and is not appropriate for emergency situations. It takes many days to process the donated blood. RISKS AND COMPLICATIONS Although transfusion therapy is very safe and saves many lives, the main dangers of transfusion include:  Getting an infectious disease. Developing a transfusion reaction. This is an allergic reaction to something in the blood you were given. Every precaution is taken to prevent this. The decision to have a blood transfusion has been considered carefully by your caregiver before blood is given. Blood is not given unless the benefits outweigh the risks.  AFTER SURGERY INSTRUCTIONS  Return to work: 4-6 weeks if applicable  Activity: 1. Be up and out of the bed during the day.  Take a nap if needed.  You may walk up steps but be careful and use the hand rail.  Stair climbing will tire you more than you think, you may need to stop part way and rest.   2. No lifting or  straining for 6 weeks over 10 pounds. No pushing, pulling, straining for 6 weeks.  3. No driving for around 6-57 when the following criteria are met: Do not drive if you are taking narcotic pain medicine and make sure that your reaction time has returned.   4. You can shower as soon as the next day after surgery. Shower daily.  Use your regular soap and water (not directly on the incision) and pat your incision(s) dry afterwards; don't rub.  No tub baths or submerging your body in water until cleared by your surgeon. If you have the soap that was given to you by pre-surgical testing that was used before surgery, you do not need to use it afterwards because this can irritate your incisions.   5. No sexual activity and nothing in the vagina for 6 weeks, 12 weeks if you have a hysterectomy (removal of the uterus and cervix).  6. You may experience a small amount of clear drainage from your incisions, which is normal.  If the drainage persists, increases, or changes color please call the office.  7. Do not use creams, lotions, or ointments such as neosporin on your incisions after surgery until advised by your surgeon because they can cause removal of the dermabond glue on your incisions.    8. You may experience vaginal spotting after surgery.  The spotting is normal but if you experience heavy bleeding, call our office.  9. Take Tylenol or ibuprofen first for pain if you are able to take these medications and only use narcotic pain medication for severe pain not relieved by the Tylenol or Ibuprofen.  Monitor your Tylenol intake to a max of 4,000 mg in a 24 hour period. You can alternate these medications after surgery.  Diet: 1. Low sodium Heart Healthy Diet is recommended but you are cleared to resume your normal (before surgery) diet after your procedure.  2. It is safe to use a laxative, such as Miralax or Colace, if you have difficulty moving your bowels. You have been prescribed Sennakot-S to  take at bedtime every evening after surgery to keep bowel movements regular and to prevent constipation.    Wound Care: 1. Keep clean and dry.  Shower daily.  Reasons to call the Doctor: Fever - Oral temperature greater than 100.4 degrees Fahrenheit Foul-smelling vaginal discharge Difficulty urinating Nausea and vomiting Increased pain at the site of the incision that is  unrelieved with pain medicine. Difficulty breathing with or without chest pain New calf pain especially if only on one side Sudden, continuing increased vaginal bleeding with or without clots.   Contacts: For questions or concerns you should contact:  Dr. Clide Cliff at [phone]  Warner Mccreedy, NP at [phone]  After Hours: call [phone] and have the GYN Oncologist paged/contacted (after 5 pm or on the weekends). You will speak with an after hours RN and let he or she know you have had surgery.  Messages sent via mychart are for non-urgent matters and are not responded to after hours so for urgent needs, please call the after hours number.

## 2022-11-23 NOTE — Progress Notes (Signed)
Gynecologic Oncology Return Clinic Visit  Date of Service: 11/23/2022 Referring Provider: Richardean Chimera, MD   Assessment & Plan: Jamie Avila is a 62 y.o. woman with a complex right ovarian cystic lesion who presents for treatment discussion.  Reviewed her workup to date.  At this time her workup is overall reassuring.  However, We reviewed that the exact etiology of the pelvic mass is unclear, but could include a benign, borderline, or malignant process.  The normal CA125 is reassuring.  Although the CA 19-9 is mildly elevated this is not particularly concerning to me.  Her cystic mass on ultrasound is predominantly cystic but does have 1 small solid peripheral component.  Reviewed that this could still be benign like an etiology of a cystadenofibroma.  However given her postmenopausal status, the complex nature of the cyst, I believe that is reasonable to undergo surgical excision for definitive diagnosis. Because the mass is relatively small, we feel that a minimally invasive approach is feasible, using robotic assistance.   In the event of malignancy or borderline tumor on frozen section, we will perform indicated staging procedures. We discussed that these procedures may include hysterectomy, omentectomy pelvic and/or para-aortic lymphadenectomy, peritoneal biopsies. We would also remove any tissue concerning for metastatic disease which could require additional procedures including bowel surgery.  Patient was consented for: Robotic assisted bilateral salpingo-oophorectomy, possible hysterectomy and staging on 12/29/22.  The risks of surgery were discussed in detail and she understands these to including but not limited to bleeding requiring a blood transfusion, infection, injury to adjacent organs (including but not limited to the bowels, bladder, ureters, nerves, blood vessels), thromboembolic events, wound separation, hernia, vaginal cuff separation, possible risk of lymphedema and  lymphocyst if lymphadenectomy performed, unforseen complication, and possible need for re-exploration.  If the patient experiences any of these events, she understands that her hospitalization or recovery may be prolonged and that she may need to take additional medications for a prolonged period. The patient will receive DVT and antibiotic prophylaxis as indicated. She voiced a clear understanding. She had the opportunity to ask questions and informed consent was obtained today. She wishes to proceed.  She does not require preoperative clearance. Her METs are >4.  All preoperative instructions were reviewed. Postoperative expectations were also reviewed. Written handouts were provided to the patient.  Pt hopes to be able to travel over Christmas.  Reviewed that if this is benign he may be able to do that.  Will plan for 1 week postop telephone visit and have her come into clinic later that week for visit with Warner Mccreedy, NP for incision check.  If healing appropriately, may be able to travel safely.  RTC postop.  Clide Cliff, MD Gynecologic Oncology   Medical Decision Making I personally spent  TOTAL 45 minutes face-to-face and non-face-to-face in the care of this patient, which includes all pre, intra, and post visit time on the date of service.   ----------------------- Reason for Visit: Follow-p  Treatment History: Patient followed up with her OB/GYN on 10/08/2022. She has been followed for a history of right ovarian cyst. CA125 in 2021 was normal at 9.6. Most recently on ultrasound on 10/08/2022 she was noted to have a right ovary with multiple cystic areas measuring 5 x 4.1 x 5.1 cm, 1.9 x 2 cm and a cluster of smaller cyst 1.8 x 1.4 cm, 1.8 x 0.7 cm, 1.3 x 1.2 cm. This cluster cyst contains increased blood flow. Repeat CA125 was not performed at this  time.   Interval History: No changes since last visit. Completed tumor markers and pelvic ultrasound.  Tumor markers were  overall reassuring with a CA125 of 11.3 and CEA 1.44.  Her CA 19-9 was mildly elevated at 37.  Pelvic ultrasound on 11/10/2022 showed a 5 cm adnexal cystic lesion with peripheral solid component.   Past Medical/Surgical History: Past Medical History:  Diagnosis Date   GERD (gastroesophageal reflux disease)    Left breast mass    Ovarian cyst 1982   Peroneal tendinitis, left 05/26/2011   Proximal humerus fracture 2016   Right knee pain 09/04/2010   Sacroiliac joint pain 09/22/2011   Pain localizes directly over SIJ SIJ movement seemed OK    Shoulder joint dislocation 10/16/2010   She had a posterior glenohumeral joint dislocation on the right and a partial a.c. joint dislocation on the left from a fall while hiking     Past Surgical History:  Procedure Laterality Date   BREAST LUMPECTOMY WITH RADIOACTIVE SEED LOCALIZATION Left 08/17/2018   Procedure: LEFT BREAST LUMPECTOMY WITH RADIOACTIVE SEED LOCALIZATION;  Surgeon: Manus Rudd, MD;  Location: [facility];  Service: General;  Laterality: Left;   COLONOSCOPY  2012   repeat 2022   HUMERUS FRACTURE SURGERY  2016   HUMERUS FRACTURE SURGERY Right    OVARIAN CYST REMOVAL  12/1979   pt can't remember the side, maybe right side    Family History  Problem Relation Age of Onset   High Cholesterol Mother    Osteoporosis Mother    COPD Mother    Arthritis Mother        rheumatoid in hands; mild   Hearing loss Mother    Colon polyps Father    High Cholesterol Father    Arthritis Father        osteo   Osteoporosis Father    CAD Father    Heart disease Maternal Grandmother        CHF; worsened after hip fracture   Early death Maternal Grandfather        uncertain cause   Heart attack Paternal Grandmother 61   Heart disease Paternal Grandmother    Early death Paternal Grandfather 41       ?valve issue   Heart attack Paternal Grandfather    Heart disease Paternal Grandfather    Breast cancer Neg Hx    Colon cancer  Neg Hx    Esophageal cancer Neg Hx    Rectal cancer Neg Hx    Stomach cancer Neg Hx    Cancer - Colon Neg Hx    Ovarian cancer Neg Hx    Endometrial cancer Neg Hx    Pancreatic cancer Neg Hx    Prostate cancer Neg Hx     Social History   Socioeconomic History   Marital status: Married    Spouse name: Not on file   Number of children: Not on file   Years of education: Not on file   Highest education level: Not on file  Occupational History   Not on file  Tobacco Use   Smoking status: Never   Smokeless tobacco: Never  Vaping Use   Vaping status: Never Used  Substance and Sexual Activity   Alcohol use: Yes    Comment: social   Drug use: Never   Sexual activity: Not on file  Other Topics Concern   Not on file  Social History Narrative   Not on file   Social Determinants of Health  Financial Resource Strain: Not on file  Food Insecurity: No Food Insecurity (10/28/2022)   Hunger Vital Sign    Worried About Running Out of Food in the Last Year: Never true    Ran Out of Food in the Last Year: Never true  Transportation Needs: No Transportation Needs (10/28/2022)   PRAPARE - Administrator, Civil Service (Medical): No    Lack of Transportation (Non-Medical): No  Physical Activity: Not on file  Stress: Not on file  Social Connections: Not on file    Current Medications:  Current Outpatient Medications:    CALCIUM PO, Take 600 mg by mouth daily., Disp: , Rfl:    Omega-3 Fatty Acids (FISH OIL PO), Take by mouth., Disp: , Rfl:    Probiotic Product (PROBIOTIC PO), Take by mouth., Disp: , Rfl:    TURMERIC PO, Take by mouth daily., Disp: , Rfl:    VITAMIN D PO, Take by mouth daily., Disp: , Rfl:   Review of Symptoms: Complete 10-system review is negative except as above in Interval History.  Physical Exam: BP 137/69 (Patient Position: Sitting)   Pulse 80   Temp 98.6 F (37 C) (Oral)   Resp 20   Wt 147 lb 9.6 oz (67 kg)   SpO2 96%   BMI 25.34 kg/m   General: Alert, oriented, no acute distress. HEENT: Normocephalic, atraumatic. Neck symmetric without masses. Sclera anicteric.  Chest: Normal work of breathing. Cardiovascular: Regular rate  Extremities: Grossly normal range of motion.  Warm, well perfused.  No edema bilaterally.   Laboratory & Radiologic Studies: Tumor markers (11/02/22): CA125: 11.3 CA 19.9: 37 CEA: 1.44  US PELVIC COMPLETE WITH TRANSVAGINAL 11/10/2022  Narrative CLINICAL DATA:  Ovarian cyst.  EXAM: TRANSABDOMINAL AND TRANSVAGINAL ULTRASOUND OF PELVIS  TECHNIQUE: Both transabdominal and transvaginal ultrasound examinations of the pelvis were performed. Transabdominal technique was performed for global imaging of the pelvis including uterus, ovaries, adnexal regions, and pelvic cul-de-sac. It was necessary to proceed with endovaginal exam following the transabdominal exam to visualize the endometrium and ovaries.  COMPARISON:  March 03, 2019.  FINDINGS: Uterus  Measurements: 6.9 x 4.7 x 3.9 cm = volume: 66 mL. 1.9 cm fibroid is noted anteriorly in the fundus.  Endometrium  Thickness: 6 mm which is within normal limits. No focal abnormality visualized.  Right ovary  Right ovary is not visualized. Right adnexal cyst measuring 5 x 5 x 4 cm is noted with peripheral complex solid component.  Left ovary  Not visualized.  Other findings  No abnormal free fluid.  IMPRESSION: 5 cm right adnexal cystic lesion is noted with peripheral solid component. MRI is recommended to evaluate for possible cystic ovarian neoplasm.   Electronically Signed By: Lupita Raider M.D. On: 11/17/2022 14:23

## 2022-11-25 NOTE — Progress Notes (Signed)
Patient here for follow up with Dr. [name] and for a pre-operative appointment prior to her scheduled surgery on 12/29/2022. She is scheduled for a robotic assisted laparoscopic bilateral salpingo-oophorectomy, possible robotic assisted laparoscopic total hysterectomy, possible staging if a precancer or cancer is seen.  The surgery was discussed in detail.  See after visit summary for additional details.    Discussed post-op pain management in detail including the aspects of the enhanced recovery pathway.  Advised her that a new prescription would be sent in for tramadol and it is only to be used for after her upcoming surgery.  We discussed the use of tylenol post-op and to monitor for a maximum of 4,000 mg in a 24 hour period.  Also prescribed sennakot to be used after surgery and to hold if having loose stools.  Discussed bowel regimen in detail.     Discussed the use of SCDs and measures to take at home to prevent DVT including frequent mobility.  Reportable signs and symptoms of DVT discussed. Post-operative instructions discussed and expectations for after surgery. Incisional care discussed as well including reportable signs and symptoms including erythema, drainage, wound separation.     10 minutes spent preparing information and with the patient.  Verbalizing understanding of material discussed. No needs or concerns voiced at the end of the visit.   Advised patient to call for any needs.  Advised that her post-operative medications had been prescribed and could be picked up at any time.    This appointment is included in the global surgical bundle as pre-operative teaching and has no charge.

## 2022-12-14 NOTE — Patient Instructions (Addendum)
SURGICAL WAITING ROOM VISITATION Patients having surgery or a procedure may have no more than 2 support people in the waiting area - these visitors may rotate.    Children under the age of 70 must have an adult with them who is not the patient.  If the patient needs to stay at the hospital during part of their recovery, the visitor guidelines for inpatient rooms apply. Pre-op nurse will coordinate an appropriate time for 1 support person to accompany patient in pre-op.  This support person may not rotate.    Please refer to the [facility] website for the visitor guidelines for Inpatients (after your surgery is over and you are in a regular room).       Your procedure is scheduled on: 12-29-22   Report to [facility] Main Entrance    Report to admitting at 8:15 AM   Call this number if you have problems the morning of surgery [phone]   Follow a light diet the day before surgery, avoid gas producing foods   Do not eat food :After Midnight.   After Midnight you may have the following liquids until 7:30 AM DAY OF SURGERY  Water Non-Citrus Juices (without pulp, NO RED-Apple, White grape, White cranberry) Black Coffee (NO MILK/CREAM OR CREAMERS, sugar ok)  Clear Tea (NO MILK/CREAM OR CREAMERS, sugar ok) regular and decaf                             Plain Jell-O (NO RED)                                           Fruit ices (not with fruit pulp, NO RED)                                     Popsicles (NO RED)                                                               Sports drinks like Gatorade (NO RED)                        If you have questions, please contact your surgeon's office.   FOLLOW  ADDITIONAL PRE OP INSTRUCTIONS YOU RECEIVED FROM YOUR SURGEON'S OFFICE!!!     Oral Hygiene is also important to reduce your risk of infection.                                    Remember - BRUSH YOUR TEETH THE MORNING OF SURGERY WITH YOUR REGULAR TOOTHPASTE   Do NOT smoke  after Midnight   Take these medicines the morning of surgery with A SIP OF WATER:  None  Stop all vitamins and herbal supplements 7 days before surgery                              You may not have any  metal on your body including hair pins, jewelry, and body piercing             Do not wear make-up, lotions, powders, perfumes, or deodorant  Do not wear nail polish including gel and S&S, artificial/acrylic nails, or any other type of covering on natural nails including finger and toenails. If you have artificial nails, gel coating, etc. that needs to be removed by a nail salon please have this removed prior to surgery or surgery may need to be canceled/ delayed if the surgeon/ anesthesia feels like they are unable to be safely monitored.   Do not shave  48 hours prior to surgery.    Do not bring valuables to the hospital. [facility] IS NOT RESPONSIBLE   FOR VALUABLES.   Contacts, dentures or bridgework may not be worn into surgery.  DO NOT BRING YOUR HOME MEDICATIONS TO THE HOSPITAL. PHARMACY WILL DISPENSE MEDICATIONS LISTED ON YOUR MEDICATION LIST TO YOU DURING YOUR ADMISSION IN THE HOSPITAL!    Patients discharged on the day of surgery will not be allowed to drive home.  Someone NEEDS to stay with you for the first 24 hours after anesthesia.   Special Instructions: Bring a copy of your healthcare power of attorney and living will documents the day of surgery if you haven't scanned them before.              Please read over the following fact sheets you were given: IF YOU HAVE QUESTIONS ABOUT YOUR PRE-OP INSTRUCTIONS PLEASE CALL [phone] [facility]  If you received a COVID test during your pre-op visit  it is requested that you wear a mask when out in public, stay away from anyone that may not be feeling well and notify your surgeon if you develop symptoms. If you test positive for Covid or have been in contact with anyone that has tested positive in the last 10 days please notify you  surgeon.  [facility] - Preparing for Surgery Before surgery, you can play an important role.  Because skin is not sterile, your skin needs to be as free of germs as possible.  You can reduce the number of germs on your skin by washing with CHG (chlorahexidine gluconate) soap before surgery.  CHG is an antiseptic cleaner which kills germs and bonds with the skin to continue killing germs even after washing. Please DO NOT use if you have an allergy to CHG or antibacterial soaps.  If your skin becomes reddened/irritated stop using the CHG and inform your nurse when you arrive at Short Stay. Do not shave (including legs and underarms) for at least 48 hours prior to the first CHG shower.  You may shave your face/neck.  Please follow these instructions carefully:  1.  Shower with CHG Soap the night before surgery and the  morning of surgery.  2.  If you choose to wash your hair, wash your hair first as usual with your normal  shampoo.  3.  After you shampoo, rinse your hair and body thoroughly to remove the shampoo.                             4.  Use CHG as you would any other liquid soap.  You can apply chg directly to the skin and wash.  Gently with a scrungie or clean washcloth.  5.  Apply the CHG Soap to your body ONLY FROM THE NECK DOWN.   Do  not use on face/ open                           Wound or open sores. Avoid contact with eyes, ears mouth and   genitals (private parts).                       Wash face,  Genitals (private parts) with your normal soap.             6.  Wash thoroughly, paying special attention to the area where your    surgery  will be performed.  7.  Thoroughly rinse your body with warm water from the neck down.  8.  DO NOT shower/wash with your normal soap after using and rinsing off the CHG Soap.                9.  Pat yourself dry with a clean towel.            10.  Wear clean pajamas.            11.  Place clean sheets on your bed the night of your first shower and do  not  sleep with pets. Day of Surgery : Do not apply any lotions/deodorants the morning of surgery.  Please wear clean clothes to the hospital/surgery center.  FAILURE TO FOLLOW THESE INSTRUCTIONS MAY RESULT IN THE CANCELLATION OF YOUR SURGERY  PATIENT SIGNATURE_________________________________  NURSE SIGNATURE__________________________________  ________________________________________________________________________    WHAT IS A BLOOD TRANSFUSION? Blood Transfusion Information  A transfusion is the replacement of blood or some of its parts. Blood is made up of multiple cells which provide different functions. Red blood cells carry oxygen and are used for blood loss replacement. White blood cells fight against infection. Platelets control bleeding. Plasma helps clot blood. Other blood products are available for specialized needs, such as hemophilia or other clotting disorders. BEFORE THE TRANSFUSION  Who gives blood for transfusions?  Healthy volunteers who are fully evaluated to make sure their blood is safe. This is blood bank blood. Transfusion therapy is the safest it has ever been in the practice of medicine. Before blood is taken from a donor, a complete history is taken to make sure that person has no history of diseases nor engages in risky social behavior (examples are intravenous drug use or sexual activity with multiple partners). The donor's travel history is screened to minimize risk of transmitting infections, such as malaria. The donated blood is tested for signs of infectious diseases, such as HIV and hepatitis. The blood is then tested to be sure it is compatible with you in order to minimize the chance of a transfusion reaction. If you or a relative donates blood, this is often done in anticipation of surgery and is not appropriate for emergency situations. It takes many days to process the donated blood. RISKS AND COMPLICATIONS Although transfusion therapy is very safe and  saves many lives, the main dangers of transfusion include:  Getting an infectious disease. Developing a transfusion reaction. This is an allergic reaction to something in the blood you were given. Every precaution is taken to prevent this. The decision to have a blood transfusion has been considered carefully by your caregiver before blood is given. Blood is not given unless the benefits outweigh the risks. AFTER THE TRANSFUSION Right after receiving a blood transfusion, you will usually feel much better and more energetic. This is especially true if  your red blood cells have gotten low (anemic). The transfusion raises the level of the red blood cells which carry oxygen, and this usually causes an energy increase. The nurse administering the transfusion will monitor you carefully for complications. HOME CARE INSTRUCTIONS  No special instructions are needed after a transfusion. You may find your energy is better. Speak with your caregiver about any limitations on activity for underlying diseases you may have. SEEK MEDICAL CARE IF:  Your condition is not improving after your transfusion. You develop redness or irritation at the intravenous (IV) site. SEEK IMMEDIATE MEDICAL CARE IF:  Any of the following symptoms occur over the next 12 hours: Shaking chills. You have a temperature by mouth above 102 F (38.9 C), not controlled by medicine. Chest, back, or muscle pain. People around you feel you are not acting correctly or are confused. Shortness of breath or difficulty breathing. Dizziness and fainting. You get a rash or develop hives. You have a decrease in urine output. Your urine turns a dark color or changes to pink, red, or brown. Any of the following symptoms occur over the next 10 days: You have a temperature by mouth above 102 F (38.9 C), not controlled by medicine. Shortness of breath. Weakness after normal activity. The white part of the eye turns yellow (jaundice). You have a  decrease in the amount of urine or are urinating less often. Your urine turns a dark color or changes to pink, red, or brown. Document Released: 01/03/2000 Document Revised: 03/30/2011 Document Reviewed: 08/22/2007 [facility] Patient Information 2014 [place], [place].  _______________________________________________________________________

## 2022-12-15 NOTE — Progress Notes (Addendum)
COVID Vaccine Completed:  Yes  Date of COVID positive in last 90 days:  No  PCP - Estela Philip Aspen, MD Cardiologist - N/A  Chest x-ray - N/A EKG - N/A Stress Test - N/A ECHO - 08-12-22 Epic Cardiac Cath - N/A Pacemaker/ICD device last checked: Spinal Cord Stimulator:N/A Cardiac CT - 06-18-21 Epic  Bowel Prep - N/A  Sleep Study - N/A CPAP -   Fasting Blood Sugar - N/A Checks Blood Sugar _____ times a day  Last dose of GLP1 agonist-  N/A GLP1 instructions:  Hold 7 days before surgery    Last dose of SGLT-2 inhibitors-  N/A SGLT-2 instructions:  Hold 3 days before surgery   Blood Thinner Instructions: N/A Aspirin Instructions: Last Dose:  Activity level:  Can go up a flight of stairs and perform activities of daily living without stopping and without symptoms of chest pain or shortness of breath.  Able to exercise without symptoms  Anesthesia review: N/A  Patient denies shortness of breath, fever, cough and chest pain at PAT appointment  Patient verbalized understanding of instructions that were given to them at the PAT appointment. Patient was also instructed that they will need to review over the PAT instructions again at home before surgery.

## 2022-12-22 ENCOUNTER — Encounter (HOSPITAL_COMMUNITY)
Admission: RE | Admit: 2022-12-22 | Discharge: 2022-12-22 | Disposition: A | Discharge status: Home / Self Care | Payer: BC Managed Care – PPO | Source: Ambulatory Visit | Attending: Psychiatry | Admitting: Psychiatry

## 2022-12-22 ENCOUNTER — Encounter (HOSPITAL_COMMUNITY): Payer: Self-pay

## 2022-12-22 ENCOUNTER — Other Ambulatory Visit: Payer: Self-pay

## 2022-12-22 DIAGNOSIS — N83209 Unspecified ovarian cyst, unspecified side: Secondary | ICD-10-CM | POA: Insufficient documentation

## 2022-12-22 DIAGNOSIS — Z01818 Encounter for other preprocedural examination: Secondary | ICD-10-CM | POA: Insufficient documentation

## 2022-12-22 HISTORY — DX: Squamous cell carcinoma of skin of unspecified lower limb, including hip: C44.721

## 2022-12-22 LAB — CBC
HCT: 44 % (ref 36.0–46.0)
Hemoglobin: 14.5 g/dL (ref 12.0–15.0)
MCH: 31 pg (ref 26.0–34.0)
MCHC: 33 g/dL (ref 30.0–36.0)
MCV: 94 fL (ref 80.0–100.0)
Platelets: 220 10*3/uL (ref 150–400)
RBC: 4.68 MIL/uL (ref 3.87–5.11)
RDW: 12.6 % (ref 11.5–15.5)
WBC: 4.2 10*3/uL (ref 4.0–10.5)
nRBC: 0 % (ref 0.0–0.2)

## 2022-12-22 LAB — COMPREHENSIVE METABOLIC PANEL
ALT: 23 U/L (ref 0–44)
AST: 22 U/L (ref 15–41)
Albumin: 4.2 g/dL (ref 3.5–5.0)
Alkaline Phosphatase: 102 U/L (ref 38–126)
Anion gap: 9 (ref 5–15)
BUN: 16 mg/dL (ref 8–23)
CO2: 23 mmol/L (ref 22–32)
Calcium: 9.4 mg/dL (ref 8.9–10.3)
Chloride: 107 mmol/L (ref 98–111)
Creatinine, Ser: 0.71 mg/dL (ref 0.44–1.00)
GFR, Estimated: >60 mL/min (ref >60)
Glucose, Bld: 90 mg/dL (ref 70–99)
Potassium: 4 mmol/L (ref 3.5–5.1)
Sodium: 139 mmol/L (ref 135–145)
Total Bilirubin: 0.9 mg/dL (ref <1.2)
Total Protein: 7.2 g/dL (ref 6.5–8.1)

## 2022-12-28 ENCOUNTER — Telehealth: Payer: Self-pay | Admitting: *Deleted

## 2022-12-28 NOTE — Telephone Encounter (Signed)
Attempted to reach patient for pre-op call. Left voicemail requesting call back.

## 2022-12-28 NOTE — Telephone Encounter (Signed)
Telephone call to check on pre-operative status.  Patient compliant with pre-operative instructions.  Reinforced nothing to eat after midnight. Clear liquids until 0715. Patient to arrive at 0815.  No questions or concerns voiced.  Instructed to call for any needs. 

## 2022-12-28 NOTE — Discharge Instructions (Signed)
AFTER SURGERY INSTRUCTIONS   Return to work: 4-6 weeks if applicable   Activity: 1. Be up and out of the bed during the day.  Take a nap if needed.  You may walk up steps but be careful and use the hand rail.  Stair climbing will tire you more than you think, you may need to stop part way and rest.    2. No lifting or straining for 6 weeks over 10 pounds. No pushing, pulling, straining for 6 weeks.   3. No driving for around 8-29 when the following criteria are met: Do not drive if you are taking narcotic pain medicine and make sure that your reaction time has returned.    4. You can shower as soon as the next day after surgery. Shower daily.  Use your regular soap and water (not directly on the incision) and pat your incision(s) dry afterwards; don't rub.  No tub baths or submerging your body in water until cleared by your surgeon. If you have the soap that was given to you by pre-surgical testing that was used before surgery, you do not need to use it afterwards because this can irritate your incisions.    5. No sexual activity and nothing in the vagina for 6 weeks, 12 weeks if you have a hysterectomy (removal of the uterus and cervix).   6. You may experience a small amount of clear drainage from your incisions, which is normal.  If the drainage persists, increases, or changes color please call the office.   7. Do not use creams, lotions, or ointments such as neosporin on your incisions after surgery until advised by your surgeon because they can cause removal of the dermabond glue on your incisions.     8. You may experience vaginal spotting after surgery.  The spotting is normal but if you experience heavy bleeding, call our office.   9. Take Tylenol or ibuprofen first for pain if you are able to take these medications and only use narcotic pain medication for severe pain not relieved by the Tylenol or Ibuprofen.  Monitor your Tylenol intake to a max of 4,000 mg in a 24 hour period. You can  alternate these medications after surgery.   Diet: 1. Low sodium Heart Healthy Diet is recommended but you are cleared to resume your normal (before surgery) diet after your procedure.   2. It is safe to use a laxative, such as Miralax or Colace, if you have difficulty moving your bowels. You have been prescribed Sennakot-S to take at bedtime every evening after surgery to keep bowel movements regular and to prevent constipation.     Wound Care: 1. Keep clean and dry.  Shower daily.   Reasons to call the Doctor: Fever - Oral temperature greater than 100.4 degrees Fahrenheit Foul-smelling vaginal discharge Difficulty urinating Nausea and vomiting Increased pain at the site of the incision that is unrelieved with pain medicine. Difficulty breathing with or without chest pain New calf pain especially if only on one side Sudden, continuing increased vaginal bleeding with or without clots.   Contacts: For questions or concerns you should contact:   Dr. Clide Cliff at [phone]   Warner Mccreedy, NP at [phone]   After Hours: call [phone] and have the GYN Oncologist paged/contacted (after 5 pm or on the weekends). You will speak with an after hours RN and let he or she know you have had surgery.   Messages sent via mychart are for non-urgent matters and are  not responded to after hours so for urgent needs, please call the after hours number.

## 2022-12-29 ENCOUNTER — Encounter (HOSPITAL_COMMUNITY): Payer: Self-pay | Admitting: Psychiatry

## 2022-12-29 ENCOUNTER — Ambulatory Visit (HOSPITAL_COMMUNITY): Payer: BC Managed Care – PPO | Admitting: Anesthesiology

## 2022-12-29 ENCOUNTER — Encounter (HOSPITAL_COMMUNITY)
Admission: RE | Disposition: A | Discharge status: Home / Self Care | Payer: Self-pay | Source: Ambulatory Visit | Attending: Psychiatry

## 2022-12-29 ENCOUNTER — Other Ambulatory Visit: Payer: Self-pay

## 2022-12-29 ENCOUNTER — Ambulatory Visit (HOSPITAL_COMMUNITY)
Admission: RE | Admit: 2022-12-29 | Discharge: 2022-12-29 | Disposition: A | Discharge status: Home / Self Care | Payer: BC Managed Care – PPO | Source: Ambulatory Visit | Attending: Psychiatry | Admitting: Psychiatry

## 2022-12-29 DIAGNOSIS — D270 Benign neoplasm of right ovary: Secondary | ICD-10-CM | POA: Diagnosis not present

## 2022-12-29 DIAGNOSIS — K219 Gastro-esophageal reflux disease without esophagitis: Secondary | ICD-10-CM | POA: Insufficient documentation

## 2022-12-29 DIAGNOSIS — N83201 Unspecified ovarian cyst, right side: Secondary | ICD-10-CM

## 2022-12-29 DIAGNOSIS — N83202 Unspecified ovarian cyst, left side: Secondary | ICD-10-CM | POA: Diagnosis not present

## 2022-12-29 DIAGNOSIS — N83209 Unspecified ovarian cyst, unspecified side: Secondary | ICD-10-CM | POA: Diagnosis not present

## 2022-12-29 DIAGNOSIS — D271 Benign neoplasm of left ovary: Secondary | ICD-10-CM | POA: Diagnosis not present

## 2022-12-29 HISTORY — PX: ROBOTIC ASSISTED SALPINGO OOPHERECTOMY: SHX6082

## 2022-12-29 LAB — ABO/RH: ABO/RH(D): A NEG

## 2022-12-29 LAB — TYPE AND SCREEN
ABO/RH(D): A NEG
Antibody Screen: NEGATIVE

## 2022-12-29 SURGERY — SALPINGO-OOPHORECTOMY, ROBOT-ASSISTED
Anesthesia: General | Laterality: Bilateral

## 2022-12-29 MED ORDER — OXYCODONE HCL 5 MG/5ML PO SOLN: 5.0000 mg | Freq: Once | ORAL | Status: AC | PRN

## 2022-12-29 MED ORDER — LIDOCAINE HCL (PF) 2 % IJ SOLN
INTRAMUSCULAR | Status: AC
Start: 2022-12-29 — End: ?
  Filled 2022-12-29: qty 5

## 2022-12-29 MED ORDER — ROCURONIUM BROMIDE 10 MG/ML (PF) SYRINGE
PREFILLED_SYRINGE | INTRAVENOUS | Status: AC
  Filled 2022-12-29: qty 10

## 2022-12-29 MED ORDER — AMISULPRIDE (ANTIEMETIC) 5 MG/2ML IV SOLN
10.0000 mg | Freq: Once | INTRAVENOUS | Status: AC | PRN
  Administered 2022-12-29: 10 mg via INTRAVENOUS

## 2022-12-29 MED ORDER — FENTANYL CITRATE (PF) 100 MCG/2ML IJ SOLN
INTRAMUSCULAR | Status: AC
  Filled 2022-12-29: qty 2

## 2022-12-29 MED ORDER — ROCURONIUM BROMIDE 10 MG/ML (PF) SYRINGE
PREFILLED_SYRINGE | INTRAVENOUS | Status: DC | PRN
  Administered 2022-12-29: 50 mg via INTRAVENOUS
  Administered 2022-12-29: 20 mg via INTRAVENOUS

## 2022-12-29 MED ORDER — SODIUM CHLORIDE 0.9% FLUSH: 3.0000 mL | Freq: Two times a day (BID) | INTRAVENOUS | Status: DC

## 2022-12-29 MED ORDER — HEPARIN SODIUM (PORCINE) 5000 UNIT/ML IJ SOLN
5000.0000 [IU] | INTRAMUSCULAR | Status: AC
  Administered 2022-12-29: 5000 [IU] via SUBCUTANEOUS
  Filled 2022-12-29: qty 1

## 2022-12-29 MED ORDER — DEXAMETHASONE SODIUM PHOSPHATE 10 MG/ML IJ SOLN
INTRAMUSCULAR | Status: AC
  Filled 2022-12-29: qty 1

## 2022-12-29 MED ORDER — BUPIVACAINE HCL 0.25 % IJ SOLN
INTRAMUSCULAR | Status: DC | PRN
  Administered 2022-12-29: 16 mL

## 2022-12-29 MED ORDER — LACTATED RINGERS IV SOLN: INTRAVENOUS | Status: DC | PRN

## 2022-12-29 MED ORDER — MIDAZOLAM HCL 2 MG/2ML IJ SOLN
INTRAMUSCULAR | Status: AC
  Filled 2022-12-29: qty 2

## 2022-12-29 MED ORDER — ONDANSETRON HCL 4 MG/2ML IJ SOLN
INTRAMUSCULAR | Status: AC
  Filled 2022-12-29: qty 2

## 2022-12-29 MED ORDER — LIDOCAINE 2% (20 MG/ML) 5 ML SYRINGE
INTRAMUSCULAR | Status: DC | PRN
  Administered 2022-12-29: 60 mg via INTRAVENOUS

## 2022-12-29 MED ORDER — ONDANSETRON HCL 4 MG/2ML IJ SOLN
INTRAMUSCULAR | Status: DC | PRN
  Administered 2022-12-29: 4 mg via INTRAVENOUS

## 2022-12-29 MED ORDER — FENTANYL CITRATE PF 50 MCG/ML IJ SOSY
PREFILLED_SYRINGE | INTRAMUSCULAR | Status: AC
  Filled 2022-12-29: qty 1

## 2022-12-29 MED ORDER — PROPOFOL 10 MG/ML IV BOLUS
INTRAVENOUS | Status: DC | PRN
  Administered 2022-12-29: 150 mg via INTRAVENOUS

## 2022-12-29 MED ORDER — PHENYLEPHRINE 80 MCG/ML (10ML) SYRINGE FOR IV PUSH (FOR BLOOD PRESSURE SUPPORT)
PREFILLED_SYRINGE | INTRAVENOUS | Status: AC
  Filled 2022-12-29: qty 10

## 2022-12-29 MED ORDER — AMISULPRIDE (ANTIEMETIC) 5 MG/2ML IV SOLN
INTRAVENOUS | Status: AC
  Filled 2022-12-29: qty 4

## 2022-12-29 MED ORDER — OXYCODONE HCL 5 MG PO TABS
ORAL_TABLET | ORAL | Status: AC
  Administered 2022-12-29: 5 mg via ORAL
  Filled 2022-12-29: qty 1

## 2022-12-29 MED ORDER — SUGAMMADEX SODIUM 200 MG/2ML IV SOLN
INTRAVENOUS | Status: DC | PRN
  Administered 2022-12-29: 200 mg via INTRAVENOUS
  Administered 2022-12-29: 50 mg via INTRAVENOUS
  Administered 2022-12-29: 150 mg via INTRAVENOUS

## 2022-12-29 MED ORDER — FENTANYL CITRATE PF 50 MCG/ML IJ SOSY
25.0000 ug | PREFILLED_SYRINGE | INTRAMUSCULAR | Status: DC | PRN
  Administered 2022-12-29: 50 ug via INTRAVENOUS

## 2022-12-29 MED ORDER — SODIUM CHLORIDE 0.9 % IV SOLN: INTRAVENOUS | Status: DC

## 2022-12-29 MED ORDER — FENTANYL CITRATE (PF) 250 MCG/5ML IJ SOLN
INTRAMUSCULAR | Status: DC | PRN
  Administered 2022-12-29 (×2): 50 ug via INTRAVENOUS

## 2022-12-29 MED ORDER — LACTATED RINGERS IR SOLN
Status: DC | PRN
  Administered 2022-12-29: 1000 mL

## 2022-12-29 MED ORDER — HYDROMORPHONE HCL 2 MG/ML IJ SOLN
INTRAMUSCULAR | Status: AC
  Filled 2022-12-29: qty 1

## 2022-12-29 MED ORDER — GLYCOPYRROLATE 0.2 MG/ML IJ SOLN
INTRAMUSCULAR | Status: DC | PRN
  Administered 2022-12-29 (×2): .1 mg via INTRAVENOUS

## 2022-12-29 MED ORDER — CHLORHEXIDINE GLUCONATE 0.12 % MT SOLN
15.0000 mL | Freq: Once | OROMUCOSAL | Status: AC
  Administered 2022-12-29: 15 mL via OROMUCOSAL

## 2022-12-29 MED ORDER — HYDROMORPHONE HCL 1 MG/ML IJ SOLN
INTRAMUSCULAR | Status: DC | PRN
  Administered 2022-12-29: .4 mg via INTRAVENOUS

## 2022-12-29 MED ORDER — ORAL CARE MOUTH RINSE: 15.0000 mL | Freq: Once | OROMUCOSAL | Status: AC

## 2022-12-29 MED ORDER — BUPIVACAINE HCL 0.25 % IJ SOLN
INTRAMUSCULAR | Status: AC
  Filled 2022-12-29: qty 1

## 2022-12-29 MED ORDER — ALBUMIN HUMAN 5 % IV SOLN
INTRAVENOUS | Status: AC
  Filled 2022-12-29: qty 250

## 2022-12-29 MED ORDER — DEXAMETHASONE SODIUM PHOSPHATE 4 MG/ML IJ SOLN
4.0000 mg | INTRAMUSCULAR | Status: AC
  Administered 2022-12-29: 8 mg via INTRAVENOUS

## 2022-12-29 MED ORDER — OXYCODONE HCL 5 MG PO TABS: 5.0000 mg | ORAL_TABLET | Freq: Once | ORAL | Status: AC | PRN

## 2022-12-29 MED ORDER — KETAMINE HCL 10 MG/ML IJ SOLN
INTRAMUSCULAR | Status: DC | PRN
  Administered 2022-12-29: 25 mg via INTRAVENOUS

## 2022-12-29 MED ORDER — MIDAZOLAM HCL 2 MG/2ML IJ SOLN
INTRAMUSCULAR | Status: DC | PRN
  Administered 2022-12-29: 2 mg via INTRAVENOUS

## 2022-12-29 MED ORDER — KETAMINE HCL 50 MG/5ML IJ SOSY
PREFILLED_SYRINGE | INTRAMUSCULAR | Status: AC
Start: 2022-12-29 — End: ?
  Filled 2022-12-29: qty 5

## 2022-12-29 MED ORDER — PROPOFOL 10 MG/ML IV BOLUS
INTRAVENOUS | Status: AC
  Filled 2022-12-29: qty 20

## 2022-12-29 MED ORDER — ACETAMINOPHEN 500 MG PO TABS
1000.0000 mg | ORAL_TABLET | ORAL | Status: AC
  Administered 2022-12-29: 1000 mg via ORAL
  Filled 2022-12-29: qty 2

## 2022-12-29 SURGICAL SUPPLY — 73 items
APPLICATOR SURGIFLO ENDO (HEMOSTASIS) IMPLANT
BAG LAPAROSCOPIC 12 15 PORT 16 (BASKET) IMPLANT
BAG RETRIEVAL 12/15 (BASKET)
BLADE SURG SZ10 CARB STEEL (BLADE) IMPLANT
COVER BACK TABLE 60X90IN (DRAPES) ×2 IMPLANT
COVER TIP SHEARS 8 DVNC (MISCELLANEOUS) ×2 IMPLANT
DERMABOND ADVANCED .7 DNX12 (GAUZE/BANDAGES/DRESSINGS) ×2 IMPLANT
DRAPE ARM DVNC X/XI (DISPOSABLE) ×8 IMPLANT
DRAPE COLUMN DVNC XI (DISPOSABLE) ×2 IMPLANT
DRAPE SHEET LG 3/4 BI-LAMINATE (DRAPES) ×2 IMPLANT
DRAPE SURG IRRIG POUCH 19X23 (DRAPES) ×2 IMPLANT
DRIVER NDL MEGA SUTCUT DVNCXI (INSTRUMENTS) ×1 IMPLANT
DRIVER NDLE MEGA SUTCUT DVNCXI (INSTRUMENTS)
DRSG OPSITE POSTOP 4X6 (GAUZE/BANDAGES/DRESSINGS) IMPLANT
DRSG OPSITE POSTOP 4X8 (GAUZE/BANDAGES/DRESSINGS) IMPLANT
ELECT PENCIL ROCKER SW 15FT (MISCELLANEOUS) IMPLANT
ELECT REM PT RETURN 15FT ADLT (MISCELLANEOUS) ×2 IMPLANT
FORCEPS BPLR FENES DVNC XI (FORCEP) ×2 IMPLANT
FORCEPS PROGRASP DVNC XI (FORCEP) ×1 IMPLANT
GAUZE 4X4 16PLY ~~LOC~~+RFID DBL (SPONGE) ×2 IMPLANT
GLOVE BIO SURGEON STRL SZ 6 (GLOVE) ×8 IMPLANT
GLOVE BIO SURGEON STRL SZ 6.5 (GLOVE) ×2 IMPLANT
GLOVE BIOGEL PI IND STRL 6.5 (GLOVE) ×4 IMPLANT
GOWN STRL REUS W/ TWL LRG LVL3 (GOWN DISPOSABLE) ×8 IMPLANT
GRASPER SUT TROCAR 14GX15 (MISCELLANEOUS) ×1 IMPLANT
HOLDER FOLEY CATH W/STRAP (MISCELLANEOUS) IMPLANT
IRRIG SUCT STRYKERFLOW 2 WTIP (MISCELLANEOUS) ×2
IRRIGATION SUCT STRKRFLW 2 WTP (MISCELLANEOUS) ×1 IMPLANT
KIT PROCEDURE DVNC SI (MISCELLANEOUS) IMPLANT
KIT TURNOVER KIT A (KITS) IMPLANT
LIGASURE IMPACT 36 18CM CVD LR (INSTRUMENTS) IMPLANT
MANIPULATOR ADVINCU DEL 3.0 PL (MISCELLANEOUS) IMPLANT
MANIPULATOR ADVINCU DEL 3.5 PL (MISCELLANEOUS) IMPLANT
MANIPULATOR UTERINE 4.5 ZUMI (MISCELLANEOUS) IMPLANT
NDL HYPO 21X1.5 SAFETY (NEEDLE) ×1 IMPLANT
NDL INSUFFLATION 14GA 120MM (NEEDLE) IMPLANT
NDL SPNL 20GX3.5 QUINCKE YW (NEEDLE) IMPLANT
NEEDLE HYPO 21X1.5 SAFETY (NEEDLE) ×2
NEEDLE INSUFFLATION 14GA 120MM (NEEDLE)
NEEDLE SPNL 20GX3.5 QUINCKE YW (NEEDLE)
OBTURATOR OPTICAL STND 8 DVNC (TROCAR) ×2
OBTURATOR OPTICALSTD 8 DVNC (TROCAR) ×1 IMPLANT
PACK ROBOT GYN CUSTOM WL (TRAY / TRAY PROCEDURE) ×2 IMPLANT
PAD ARMBOARD 7.5X6 YLW CONV (MISCELLANEOUS) ×2 IMPLANT
PAD POSITIONING PINK XL (MISCELLANEOUS) ×2 IMPLANT
PORT ACCESS TROCAR AIRSEAL 12 (TROCAR) ×1 IMPLANT
SCISSORS MNPLR CVD DVNC XI (INSTRUMENTS) ×2 IMPLANT
SCRUB CHG 4% DYNA-HEX 4OZ (MISCELLANEOUS) ×4 IMPLANT
SEAL UNIV 5-12 XI (MISCELLANEOUS) ×8 IMPLANT
SET TRI-LUMEN FLTR TB AIRSEAL (TUBING) ×2 IMPLANT
SPIKE FLUID TRANSFER (MISCELLANEOUS) ×2 IMPLANT
SPONGE T-LAP 18X18 ~~LOC~~+RFID (SPONGE) IMPLANT
SURGIFLO W/THROMBIN 8M KIT (HEMOSTASIS) IMPLANT
SUT MNCRL AB 4-0 PS2 18 (SUTURE) IMPLANT
SUT PDS AB 1 TP1 54 (SUTURE) IMPLANT
SUT VIC AB 0 CT1 27XBRD ANTBC (SUTURE) IMPLANT
SUT VIC AB 2-0 CT1 TAPERPNT 27 (SUTURE) IMPLANT
SUT VIC AB 4-0 PS2 18 (SUTURE) ×4 IMPLANT
SUT VICRYL 0 27 CT2 27 ABS (SUTURE) ×2 IMPLANT
SUT VLOC 180 0 9IN GS21 (SUTURE) IMPLANT
SYR 10ML LL (SYRINGE) IMPLANT
SYS BAG RETRIEVAL 10MM (BASKET) ×2
SYS WOUND ALEXIS 18CM MED (MISCELLANEOUS)
SYSTEM BAG RETRIEVAL 10MM (BASKET) IMPLANT
SYSTEM WOUND ALEXIS 18CM MED (MISCELLANEOUS) IMPLANT
TOWEL OR NON WOVEN STRL DISP B (DISPOSABLE) IMPLANT
TRAP SPECIMEN MUCUS 40CC (MISCELLANEOUS) ×1 IMPLANT
TRAY FOLEY MTR SLVR 16FR STAT (SET/KITS/TRAYS/PACK) ×2 IMPLANT
TROCAR PORT AIRSEAL 5X120 (TROCAR) IMPLANT
UNDERPAD 30X36 HEAVY ABSORB (UNDERPADS AND DIAPERS) ×4 IMPLANT
WATER STERILE IRR 1000ML POUR (IV SOLUTION) ×2 IMPLANT
WATER STERILE IRR 500ML POUR (IV SOLUTION) ×2 IMPLANT
YANKAUER SUCT BULB TIP 10FT TU (MISCELLANEOUS) IMPLANT

## 2022-12-29 NOTE — Op Note (Signed)
GYNECOLOGIC ONCOLOGY OPERATIVE NOTE  Date of Service: 12/29/2022  Preoperative Diagnosis: Complex ovarian cyst  Postoperative Diagnosis: Right ovarian complex cyst  Procedures: Robotic assisted bilateral salpingo-oophorectomy  Surgeon: Clide Cliff, MD  Assistants: Antionette Char, MD and (an MD assistant was necessary for tissue manipulation, management of robotic instrumentation, retraction and positioning due to the complexity of the case and hospital policies)  Anesthesia: General  Estimated Blood Loss: 15 ml  Fluids: see anesthesia record  Urine Output: 150 ml, clear yellow  Findings: On entry to abdomen, normal upper abdominal survey with smooth diaphragm, liver, stomach and normal appearing omentum and bowel. In the pelvis, small normal appearing uterus, bilateral fallopian tubes and left ovary. Right ovary with an approximate 5cm cystic mass. No ascites or peritoneal disease. IOFS benign cyst.   Specimens:  ID Type Source Tests Collected by Time Destination  1 : Bilateral tubes and ovaries Tissue PATH Gyn tumor resection SURGICAL PATHOLOGY Clide Cliff, MD 12/29/2022 1228   A : Pelvic washings Body Fluid PATH Cytology Pelvic Washing CYTOLOGY - NON PAP Clide Cliff, MD 12/29/2022 1216     Complications:  None  Indications for Procedure: Jamie Avila is a 62 y.o. woman with a complex adnexal mass and occasional right lower quadrant pain.  Prior to the procedure, all risks, benefits, and alternatives were discussed and informed surgical consent was signed.  Procedure: Patient was taken to the operating room where general anesthesia was achieved.  She was positioned in dorsal lithotomy and prepped and draped.  A foley catheter was inserted into the bladder.  A hulka manipulator was secured in the cervix.  A 12 mm incision was made in the left upper quadrant near Palmer's point.  The abdomen was entered with a 5 mm OptiView trocar under direct  visualization.  The abdomen was insufflated, the patient placed in steep Trendelenburg, and additional trocars were placed as follows: an 8mm robotic trocar superior to the umbilicus, one 8 mm robotic trocar in the right abdomen, and one 8 mm robotic trocar in the left abdomen.  The left upper quadrant trocar was removed and replaced with a 12 mm airseal trocar.  All trocars were placed under direct visualization.  The bowels were moved into the upper abdomen.  The DaVinci robotic surgical system was brought to the patient's bedside and docked.  The right pelvic peritoneum was incised and the retroperitoneum entered.  The right ureter was identified.  The right infundibulopelvic ligament was isolated, cauterized, and transected.  The broad ligament was incised to the uterine cornu.  The utero-ovarian ligament and the proximal fallopian tube were isolated, cauterized, and transected.  The same procedure was performed on the contralateral side.  Both specimens were placed into an Endo-Catch bag and removed through the 10 mm trocar.  Frozen pathology returned benign. The pelvis was inspected and all operative sites were found to be hemostatic.  All instruments were removed and the robot was taken from the patient's bedside. The fascia at the 12 mm incision was closed with 0 Vicryl with the assistance of a PMI device. The abdomen was desufflated and all ports were removed. The skin at all incisions was closed with 4-0 Vicryl to reapproximate the subcutaneous tissue and 4-0 monocryl in a subcuticular fashion followed by surgical glue.  Patient tolerated the procedure well. Sponge, lap, and instrument counts were correct.  No perioperative antibiotic prophylaxis was indicated for this procedure.  She was extubated and taken to the PACU in stable condition.  [name]  [name], MD Gynecologic Oncology

## 2022-12-29 NOTE — H&P (Signed)
Brief Pre-operative History & Physical  Patient name: Jamie Avila CSN: [id number] MRN: [id number] Admit Date: 12/29/2022 Date of Surgery: 12/29/2022 Performing Service: Gynecology   Code Status: Full Code    Assessment & Plan    Jamie Avila is a 62 y.o. female with COMPLEX OVARIAN CYST, who presents for: Procedure(s) (LRB): XI ROBOTIC ASSISTED SALPINGO OOPHORECTOMY (Bilateral) POSSIBLE XI ROBOTIC ASSISTED TOTAL HYSTERECTOMY, POSSIBLE STAGING (N/A).   Consent obtained in office is accurate. Risks, benefits, and alternatives to surgery were reviewed, and all questions were answered.  Proceed to the OR as planned.     History of Present Illness:  Jamie Avila is a 62 y.o. female with COMPLEX OVARIAN CYST. She was recently seen in clinic, where a detailed HPI can be found. She was noted to benefit from: Procedure(s) (LRB): XI ROBOTIC ASSISTED SALPINGO OOPHORECTOMY (Bilateral) POSSIBLE XI ROBOTIC ASSISTED TOTAL HYSTERECTOMY, POSSIBLE STAGING (N/A).    Allergies Penicillins  Medications   Current Facility-Administered Medications  Medication Dose Route Frequency Provider Last Rate Last Admin   0.9 %  sodium chloride infusion   Intravenous Continuous Marcene Duos, MD 40 mL/hr at 12/29/22 5621 Continued from Pre-op at 12/29/22 0942   dexamethasone (DECADRON) injection 4 mg  4 mg Intravenous On Call to OR Cross, Melissa D, NP        Vital Signs BP (!) 140/80 (BP Location: Right Arm)   Pulse 69   Temp 98.1 F (36.7 C) (Oral)   Resp 16   Ht 5\' 4"  (1.626 m)   Wt 145 lb 3.2 oz (65.9 kg)   SpO2 98%   BMI 24.92 kg/m  Facility age limit for growth %iles is 20 years. Facility age limit for growth %iles is 20 years..   Physical Exam General: Well developed, appears stated age, in no acute distress  Mental status: Alert and oriented x3 Cardiovascular: Normal Pulmonary: Symmetric chest rise, unlabored breathing Relevant System for Surgery: Surgical site  examination deferred to the OR   Labs and Studies: Lab Results  Component Value Date   WBC 4.2 12/22/2022   HGB 14.5 12/22/2022   HCT 44.0 12/22/2022   PLT 220 12/22/2022    No results found for: "INR", "APTT" \

## 2022-12-29 NOTE — Anesthesia Preprocedure Evaluation (Signed)
Anesthesia Evaluation  Patient identified by MRN, date of birth, ID band Patient awake    Reviewed: Allergy & Precautions, NPO status , Patient's Chart, lab work & pertinent test results  Airway Mallampati: II  TM Distance: >3 FB Neck ROM: Full    Dental  (+) Dental Advisory Given   Pulmonary neg pulmonary ROS   breath sounds clear to auscultation       Cardiovascular negative cardio ROS  Rhythm:Regular Rate:Normal     Neuro/Psych negative neurological ROS     GI/Hepatic Neg liver ROS,GERD  ,,  Endo/Other  negative endocrine ROS    Renal/GU negative Renal ROS     Musculoskeletal   Abdominal   Peds  Hematology negative hematology ROS (+)   Anesthesia Other Findings   Reproductive/Obstetrics                             Anesthesia Physical Anesthesia Plan  ASA: 2  Anesthesia Plan: General   Post-op Pain Management: Tylenol PO (pre-op)* and Toradol IV (intra-op)*   Induction: Intravenous  PONV Risk Score and Plan: 3 and Dexamethasone, Ondansetron and Midazolam  Airway Management Planned: Oral ETT  Additional Equipment: None  Intra-op Plan:   Post-operative Plan: Extubation in OR  Informed Consent: I have reviewed the patients History and Physical, chart, labs and discussed the procedure including the risks, benefits and alternatives for the proposed anesthesia with the patient or authorized representative who has indicated his/her understanding and acceptance.     Dental advisory given  Plan Discussed with: CRNA  Anesthesia Plan Comments:        Anesthesia Quick Evaluation

## 2022-12-29 NOTE — Anesthesia Procedure Notes (Signed)
Procedure Name: Intubation Date/Time: 12/29/2022 11:40 AM  Performed by: [place], Clydene Pugh, CRNAPre-anesthesia Checklist: Patient identified, Emergency Drugs available, Suction available, Patient being monitored and Timeout performed Patient Re-evaluated:Patient Re-evaluated prior to induction Oxygen Delivery Method: Circle system utilized Preoxygenation: Pre-oxygenation with 100% oxygen Induction Type: IV induction Ventilation: Mask ventilation without difficulty Laryngoscope Size: Mac and 3 Grade View: Grade I Tube type: Oral Tube size: 7.5 mm Number of attempts: 1 Airway Equipment and Method: Stylet Placement Confirmation: ETT inserted through vocal cords under direct vision, positive ETCO2 and breath sounds checked- equal and bilateral Secured at: 21 cm Tube secured with: Tape Dental Injury: Teeth and Oropharynx as per pre-operative assessment

## 2022-12-29 NOTE — Transfer of Care (Signed)
Immediate Anesthesia Transfer of Care Note  Patient: Jamie Avila  Procedure(s) Performed: XI ROBOTIC ASSISTED SALPINGO OOPHORECTOMY (Bilateral)  Patient Location: PACU  Anesthesia Type:General  Level of Consciousness: awake, alert , and oriented  Airway & Oxygen Therapy: Patient Spontanous Breathing and Patient connected to face mask oxygen  Post-op Assessment: Report given to RN and Post -op Vital signs reviewed and stable  Post vital signs: Reviewed and stable  Last Vitals:  Vitals Value Taken Time  BP 123/69 12/29/22 1324  Temp 36.4 C 12/29/22 1324  Pulse 61 12/29/22 1328  Resp 18 12/29/22 1328  SpO2 100 % 12/29/22 1328  Vitals shown include unfiled device data.  Last Pain:  Vitals:   12/29/22 1324  TempSrc:   PainSc: Asleep         Complications: No notable events documented.

## 2022-12-30 ENCOUNTER — Encounter (HOSPITAL_COMMUNITY): Payer: Self-pay | Admitting: Psychiatry

## 2022-12-30 ENCOUNTER — Telehealth: Payer: Self-pay | Admitting: *Deleted

## 2022-12-30 LAB — CYTOLOGY - NON PAP

## 2022-12-30 NOTE — Telephone Encounter (Signed)
Spoke with Ms. Top this morning. She states she is eating, drinking and urinating well. She has not had a BM yet but is passing gas. She has not taken senokot but did take a dose of miralax. Pt states she tends to have loose stools and wants to hold off on senokot-s at this time.  Encouraged her to drink plenty of water. She denies fever or chills. Incisions are dry and intact. She rates her pain 2/10. Her pain is controlled with tramadol which she has only taken once.     Instructed to call office with any fever, chills, purulent drainage, uncontrolled pain or any other questions or concerns. Patient verbalizes understanding.   Pt aware of post op appointments as well as the office number [phone] and after hours number [phone] to call if she has any questions or concerns

## 2022-12-31 LAB — SURGICAL PATHOLOGY

## 2022-12-31 NOTE — Anesthesia Postprocedure Evaluation (Signed)
Anesthesia Post Note  Patient: Jamie Avila  Procedure(s) Performed: XI ROBOTIC ASSISTED SALPINGO OOPHORECTOMY (Bilateral)     Patient location during evaluation: PACU Anesthesia Type: General Level of consciousness: awake and alert Pain management: pain level controlled Vital Signs Assessment: post-procedure vital signs reviewed and stable Respiratory status: spontaneous breathing, nonlabored ventilation, respiratory function stable and patient connected to nasal cannula oxygen Cardiovascular status: blood pressure returned to baseline and stable Postop Assessment: no apparent nausea or vomiting Anesthetic complications: no   No notable events documented.  Last Vitals:  Vitals:   12/29/22 1430 12/29/22 1445  BP: 121/66 118/66  Pulse: (!) 47 (!) 47  Resp:    Temp:    SpO2: 93% 93%    Last Pain:  Vitals:   12/29/22 1500  TempSrc:   PainSc: 3                  Kennieth Rad

## 2023-01-04 ENCOUNTER — Encounter: Payer: Self-pay | Admitting: Psychiatry

## 2023-01-04 ENCOUNTER — Inpatient Hospital Stay: Payer: BC Managed Care – PPO | Attending: Psychiatry | Admitting: Psychiatry

## 2023-01-04 DIAGNOSIS — N83209 Unspecified ovarian cyst, unspecified side: Secondary | ICD-10-CM

## 2023-01-04 DIAGNOSIS — Z90722 Acquired absence of ovaries, bilateral: Secondary | ICD-10-CM

## 2023-01-04 NOTE — Progress Notes (Signed)
Gynecologic Oncology Telehealth Follow-Up Note  I connected with Jamie Avila on 01/04/23 at  4:15 PM EST by telephone and verified that I am speaking with the correct person using two identifiers.  I discussed the limitations, risks, security and privacy concerns of performing an evaluation and management service by telemedicine and the availability of in-person appointments. I also discussed with the patient that there may be a patient responsible charge related to this service. The patient expressed understanding and agreed to proceed.  Other persons participating in the visit and their role in the encounter: none.  Patient's location: Home, [place] Provider's location: [facility]  Date of Service: 01/04/2023 Referring Provider: Richardean Chimera, MD   Assessment & Plan: Jamie Avila is a 62 y.o. woman with right ovarian cystic mass, s/p RA-BSO on 12/29/22, final pathology benign.  Postop: - Pt recovering well from surgery and healing appropriately postoperatively - Intraoperative findings and pathology results reviewed. - Ongoing postoperative expectations and precautions reviewed. Continue with no lifting >10lbs through 2 weeks postoperatively - After follow-up, okay to return to routine Ob/Gyn care. Uterus in situ. Complete pap smear screening per ASCCP guidelines.  RTC Friday for incision check.  Clide Cliff, MD Gynecologic Oncology    ----------------------- Reason for Visit: Postop  Interval History: Patient reports she is overall doing well since surgery.  Took tramadol for 1 night.  Then she took Tylenol and ibuprofen as needed but has not needed anything for the past 3 days.  She is eating and drinking at home.  She is voiding and having regular bowel movements.  Notes some soreness of the left upper quadrant incision compared to the other incisions and gas and bloating, but overall doing well.  Past Medical/Surgical History: Past Medical History:  Diagnosis  Date   GERD (gastroesophageal reflux disease)    Left breast mass    Ovarian cyst 1982   Peroneal tendinitis, left 05/26/2011   Proximal humerus fracture 2016   Right knee pain 09/04/2010   Sacroiliac joint pain 09/22/2011   Pain localizes directly over SIJ SIJ movement seemed OK    Shoulder joint dislocation 10/16/2010   She had a posterior glenohumeral joint dislocation on the right and a partial a.c. joint dislocation on the left from a fall while hiking    Squamous cell carcinoma of leg     Past Surgical History:  Procedure Laterality Date   BREAST LUMPECTOMY WITH RADIOACTIVE SEED LOCALIZATION Left 08/17/2018   Procedure: LEFT BREAST LUMPECTOMY WITH RADIOACTIVE SEED LOCALIZATION;  Surgeon: Manus Rudd, MD;  Location: [facility];  Service: General;  Laterality: Left;   COLONOSCOPY  2012   repeat 2022   HUMERUS FRACTURE SURGERY  2016   HUMERUS FRACTURE SURGERY Right    OVARIAN CYST REMOVAL  12/1979   pt can't remember the side, maybe right side   ROBOTIC ASSISTED SALPINGO OOPHERECTOMY Bilateral 12/29/2022   Procedure: XI ROBOTIC ASSISTED SALPINGO OOPHORECTOMY;  Surgeon: Clide Cliff, MD;  Location: [facility];  Service: Gynecology;  Laterality: Bilateral;    Family History  Problem Relation Age of Onset   High Cholesterol Mother    Osteoporosis Mother    COPD Mother    Arthritis Mother        rheumatoid in hands; mild   Hearing loss Mother    Colon polyps Father    High Cholesterol Father    Arthritis Father        osteo   Osteoporosis Father    CAD Father  Heart disease Maternal Grandmother        CHF; worsened after hip fracture   Early death Maternal Grandfather        uncertain cause   Heart attack Paternal Grandmother 27   Heart disease Paternal Grandmother    Early death Paternal Grandfather 83       ?valve issue   Heart attack Paternal Grandfather    Heart disease Paternal Grandfather    Breast cancer Neg Hx    Colon cancer Neg Hx     Esophageal cancer Neg Hx    Rectal cancer Neg Hx    Stomach cancer Neg Hx    Cancer - Colon Neg Hx    Ovarian cancer Neg Hx    Endometrial cancer Neg Hx    Pancreatic cancer Neg Hx    Prostate cancer Neg Hx     Social History   Socioeconomic History   Marital status: Married    Spouse name: Not on file   Number of children: Not on file   Years of education: Not on file   Highest education level: Not on file  Occupational History   Not on file  Tobacco Use   Smoking status: Never   Smokeless tobacco: Never  Vaping Use   Vaping status: Never Used  Substance and Sexual Activity   Alcohol use: Yes    Comment: social   Drug use: Never   Sexual activity: Not on file  Other Topics Concern   Not on file  Social History Narrative   Not on file   Social Drivers of Health   Financial Resource Strain: Not on file  Food Insecurity: No Food Insecurity (10/28/2022)   Hunger Vital Sign    Worried About Running Out of Food in the Last Year: Never true    Ran Out of Food in the Last Year: Never true  Transportation Needs: No Transportation Needs (10/28/2022)   PRAPARE - Administrator, Civil Service (Medical): No    Lack of Transportation (Non-Medical): No  Physical Activity: Not on file  Stress: Not on file  Social Connections: Not on file    Current Medications:  Current Outpatient Medications:    senna-docusate (SENOKOT-S) 8.6-50 MG tablet, Take 2 tablets by mouth at bedtime. For AFTER surgery, do not take if having diarrhea, Disp: 30 tablet, Rfl: 0   traMADol (ULTRAM) 50 MG tablet, Take 1 tablet (50 mg total) by mouth every 6 (six) hours as needed for severe pain (pain score 7-10). For AFTER surgery only, do not take and drive, Disp: 10 tablet, Rfl: 0  Review of Symptoms: Pertinent positives as per HPI.  Physical Exam: Deferred given limitations of phone visit.  Laboratory & Radiologic Studies: Surgical pathology (12/29/22): A. OVARIES AND FALLOPIAN TUBES,  BILATERAL, SALPINGOOPHORECTOMY:  Ovary and fallopian tube with collapsed cyst or frozen section:      Benign mucinous cystadenoma      Negative for borderline change or malignancy      Benign fallopian tube   Second ovary and fallopian tube:      Benign serous cysts      Negative for endometriosis or malignancy      Benign fallopian tube   Cytology (12/29/22): FINAL MICROSCOPIC DIAGNOSIS:  - No malignant cells identified

## 2023-01-08 ENCOUNTER — Inpatient Hospital Stay: Payer: BC Managed Care – PPO | Admitting: Gynecologic Oncology

## 2023-01-08 VITALS — BP 119/70 | HR 76 | Temp 98.1°F | Wt 145.4 lb

## 2023-01-08 DIAGNOSIS — Z90721 Acquired absence of ovaries, unilateral: Secondary | ICD-10-CM

## 2023-01-08 DIAGNOSIS — D279 Benign neoplasm of unspecified ovary: Secondary | ICD-10-CM

## 2023-01-08 NOTE — Patient Instructions (Signed)
-  It was nice to see you today. You are healing well after surgery. Continue with no lifting >10lbs through 2 weeks postoperatively (until Dec 24).  -You are ok to return to routine Ob/Gyn care for your continued well woman care. Plan to continue pap smear screening per ASCCP guidelines since your uterus is still in place.  -Please call for any needs or new symptoms during this healing time after surgery.

## 2023-01-08 NOTE — Progress Notes (Signed)
Gynecologic Oncology Post Operative Follow-Up Note  Date of Service: 01/08/2023  Referring Provider: Richardean Chimera, MD   Assessment & Plan: Jamie Avila is a 62 y.o. woman with right ovarian cystic mass, s/p RA-BSO on 12/29/22, final pathology benign.  Postop: - Pt recovering well from surgery and healing appropriately postoperatively - Ongoing postoperative expectations and precautions reviewed. Continue with no lifting >10lbs through 2 weeks postoperatively - She has been cleared to return to routine Ob/Gyn care. Uterus in situ. Complete pap smear screening per ASCCP guidelines.  She is advised to call for any needs, new symptoms related to surgery.  [name] Gynecologic Oncology  Reason for Visit: Postop  HPI: Patient followed up with her OB/GYN on 10/08/2022. She had been followed for a history of a right ovarian cyst. CA125 in 2021 was normal at 9.6. Most recently on ultrasound on 10/08/2022, she was noted to have a right ovary with multiple cystic areas measuring 5 x 4.1 x 5.1 cm, 1.9 x 2 cm and a cluster of smaller cyst 1.8 x 1.4 cm, 1.8 x 0.7 cm, 1.3 x 1.2 cm. This cluster cyst contains increased blood flow. Repeat CA125 was not performed at this time.   She underwent robotic assisted bilateral salpingo-oophorectomy by Dr. [name] on 12/29/22. Final pathology returned with a benign mucinous cystadenoma.     Interval History: Patient reports she is overall doing well since surgery.  Took tramadol for 1 night.  Then she took Tylenol and ibuprofen as needed but has not needed anything after.  She is tolerating her diet with no nausea or emesis.  She is voiding and having regular bowel movements.  Continues to note some soreness of the left upper quadrant incision compared to the other incisions and gas and bloating, but overall doing well.  Past Medical/Surgical History: Past Medical History:  Diagnosis Date   GERD (gastroesophageal reflux disease)    Left breast  mass    Ovarian cyst 1982   Peroneal tendinitis, left 05/26/2011   Proximal humerus fracture 2016   Right knee pain 09/04/2010   Sacroiliac joint pain 09/22/2011   Pain localizes directly over SIJ SIJ movement seemed OK    Shoulder joint dislocation 10/16/2010   She had a posterior glenohumeral joint dislocation on the right and a partial a.c. joint dislocation on the left from a fall while hiking    Squamous cell carcinoma of leg     Past Surgical History:  Procedure Laterality Date   BREAST LUMPECTOMY WITH RADIOACTIVE SEED LOCALIZATION Left 08/17/2018   Procedure: LEFT BREAST LUMPECTOMY WITH RADIOACTIVE SEED LOCALIZATION;  Surgeon: Manus Rudd, MD;  Location: [facility];  Service: General;  Laterality: Left;   COLONOSCOPY  2012   repeat 2022   HUMERUS FRACTURE SURGERY  2016   HUMERUS FRACTURE SURGERY Right    OVARIAN CYST REMOVAL  12/1979   pt can't remember the side, maybe right side   ROBOTIC ASSISTED SALPINGO OOPHERECTOMY Bilateral 12/29/2022   Procedure: XI ROBOTIC ASSISTED SALPINGO OOPHORECTOMY;  Surgeon: Clide Cliff, MD;  Location: [facility];  Service: Gynecology;  Laterality: Bilateral;    Family History  Problem Relation Age of Onset   High Cholesterol Mother    Osteoporosis Mother    COPD Mother    Arthritis Mother        rheumatoid in hands; mild   Hearing loss Mother    Colon polyps Father    High Cholesterol Father    Arthritis Father  osteo   Osteoporosis Father    CAD Father    Heart disease Maternal Grandmother        CHF; worsened after hip fracture   Early death Maternal Grandfather        uncertain cause   Heart attack Paternal Grandmother 25   Heart disease Paternal Grandmother    Early death Paternal Grandfather 15       ?valve issue   Heart attack Paternal Grandfather    Heart disease Paternal Grandfather    Breast cancer Neg Hx    Colon cancer Neg Hx    Esophageal cancer Neg Hx    Rectal cancer Neg Hx    Stomach  cancer Neg Hx    Cancer - Colon Neg Hx    Ovarian cancer Neg Hx    Endometrial cancer Neg Hx    Pancreatic cancer Neg Hx    Prostate cancer Neg Hx     Social History   Socioeconomic History   Marital status: Married    Spouse name: Not on file   Number of children: Not on file   Years of education: Not on file   Highest education level: Not on file  Occupational History   Not on file  Tobacco Use   Smoking status: Never   Smokeless tobacco: Never  Vaping Use   Vaping status: Never Used  Substance and Sexual Activity   Alcohol use: Yes    Comment: social   Drug use: Never   Sexual activity: Not on file  Other Topics Concern   Not on file  Social History Narrative   Not on file   Social Drivers of Health   Financial Resource Strain: Not on file  Food Insecurity: No Food Insecurity (10/28/2022)   Hunger Vital Sign    Worried About Running Out of Food in the Last Year: Never true    Ran Out of Food in the Last Year: Never true  Transportation Needs: No Transportation Needs (10/28/2022)   PRAPARE - Administrator, Civil Service (Medical): No    Lack of Transportation (Non-Medical): No  Physical Activity: Not on file  Stress: Not on file  Social Connections: Not on file    Current Medications:  Current Outpatient Medications:    senna-docusate (SENOKOT-S) 8.6-50 MG tablet, Take 2 tablets by mouth at bedtime. For AFTER surgery, do not take if having diarrhea, Disp: 30 tablet, Rfl: 0   traMADol (ULTRAM) 50 MG tablet, Take 1 tablet (50 mg total) by mouth every 6 (six) hours as needed for severe pain (pain score 7-10). For AFTER surgery only, do not take and drive, Disp: 10 tablet, Rfl: 0  Review of Symptoms: Pertinent positives as per HPI.  Physical Exam: Alert, oriented, in no acute distress Lungs clear. Heart regular in rate and rhythm. Abdomen soft. Laparoscopic incisions with dermabond are healing well without erythema or drainage. No lower extremity  edema bilaterally  Laboratory & Radiologic Studies: Surgical pathology (12/29/22): A. OVARIES AND FALLOPIAN TUBES, BILATERAL, SALPINGOOPHORECTOMY:  Ovary and fallopian tube with collapsed cyst or frozen section:      Benign mucinous cystadenoma      Negative for borderline change or malignancy      Benign fallopian tube   Second ovary and fallopian tube:      Benign serous cysts      Negative for endometriosis or malignancy      Benign fallopian tube   Cytology (12/29/22): FINAL MICROSCOPIC DIAGNOSIS:  - No malignant  cells identified

## 2023-01-15 DIAGNOSIS — D279 Benign neoplasm of unspecified ovary: Secondary | ICD-10-CM | POA: Insufficient documentation

## 2023-01-18 ENCOUNTER — Encounter: Payer: BC Managed Care – PPO | Admitting: Psychiatry

## 2023-06-17 DIAGNOSIS — B078 Other viral warts: Secondary | ICD-10-CM | POA: Diagnosis not present

## 2023-06-17 DIAGNOSIS — L821 Other seborrheic keratosis: Secondary | ICD-10-CM | POA: Diagnosis not present

## 2023-06-17 DIAGNOSIS — L720 Epidermal cyst: Secondary | ICD-10-CM | POA: Diagnosis not present

## 2023-07-12 ENCOUNTER — Ambulatory Visit (INDEPENDENT_AMBULATORY_CARE_PROVIDER_SITE_OTHER): Payer: BC Managed Care – PPO | Admitting: Internal Medicine

## 2023-07-12 ENCOUNTER — Encounter: Payer: Self-pay | Admitting: Internal Medicine

## 2023-07-12 ENCOUNTER — Ambulatory Visit: Payer: Self-pay | Admitting: Internal Medicine

## 2023-07-12 VITALS — BP 120/76 | HR 72 | Temp 97.5°F | Resp 18 | Ht 64.57 in | Wt 133.7 lb

## 2023-07-12 DIAGNOSIS — Z0000 Encounter for general adult medical examination without abnormal findings: Secondary | ICD-10-CM

## 2023-07-12 DIAGNOSIS — E782 Mixed hyperlipidemia: Secondary | ICD-10-CM | POA: Diagnosis not present

## 2023-07-12 DIAGNOSIS — M79675 Pain in left toe(s): Secondary | ICD-10-CM

## 2023-07-12 DIAGNOSIS — E559 Vitamin D deficiency, unspecified: Secondary | ICD-10-CM

## 2023-07-12 LAB — CBC WITH DIFFERENTIAL/PLATELET
Basophils Absolute: 0.1 10*3/uL (ref 0.0–0.1)
Basophils Relative: 1.3 % (ref 0.0–3.0)
Eosinophils Absolute: 0.1 10*3/uL (ref 0.0–0.7)
Eosinophils Relative: 1.5 % (ref 0.0–5.0)
HCT: 41.7 % (ref 36.0–46.0)
Hemoglobin: 14.1 g/dL (ref 12.0–15.0)
Lymphocytes Relative: 34.3 % (ref 12.0–46.0)
Lymphs Abs: 1.6 10*3/uL (ref 0.7–4.0)
MCHC: 33.7 g/dL (ref 30.0–36.0)
MCV: 90.6 fl (ref 78.0–100.0)
Monocytes Absolute: 0.4 10*3/uL (ref 0.1–1.0)
Monocytes Relative: 8.2 % (ref 3.0–12.0)
Neutro Abs: 2.5 10*3/uL (ref 1.4–7.7)
Neutrophils Relative %: 54.7 % (ref 43.0–77.0)
Platelets: 200 10*3/uL (ref 150.0–400.0)
RBC: 4.61 Mil/uL (ref 3.87–5.11)
RDW: 12.8 % (ref 11.5–15.5)
WBC: 4.6 10*3/uL (ref 4.0–10.5)

## 2023-07-12 LAB — LIPID PANEL
Cholesterol: 240 mg/dL — ABNORMAL HIGH (ref 0–200)
HDL: 43.1 mg/dL (ref >39.00)
LDL Cholesterol: 133 mg/dL — ABNORMAL HIGH (ref 0–99)
NonHDL: 196.62
Total CHOL/HDL Ratio: 6
Triglycerides: 317 mg/dL — ABNORMAL HIGH (ref 0.0–149.0)
VLDL: 63.4 mg/dL — ABNORMAL HIGH (ref 0.0–40.0)

## 2023-07-12 LAB — COMPREHENSIVE METABOLIC PANEL WITH GFR
ALT: 15 U/L (ref 0–35)
AST: 17 U/L (ref 0–37)
Albumin: 4.4 g/dL (ref 3.5–5.2)
Alkaline Phosphatase: 116 U/L (ref 39–117)
BUN: 20 mg/dL (ref 6–23)
CO2: 26 meq/L (ref 19–32)
Calcium: 9.5 mg/dL (ref 8.4–10.5)
Chloride: 103 meq/L (ref 96–112)
Creatinine, Ser: 0.87 mg/dL (ref 0.40–1.20)
GFR: 70.97 mL/min (ref >60.00)
Glucose, Bld: 88 mg/dL (ref 70–99)
Potassium: 4.4 meq/L (ref 3.5–5.1)
Sodium: 140 meq/L (ref 135–145)
Total Bilirubin: 0.6 mg/dL (ref 0.2–1.2)
Total Protein: 7 g/dL (ref 6.0–8.3)

## 2023-07-12 LAB — VITAMIN D 25 HYDROXY (VIT D DEFICIENCY, FRACTURES): VITD: 24.24 ng/mL — ABNORMAL LOW (ref 30.00–100.00)

## 2023-07-12 LAB — TSH: TSH: 1.66 u[IU]/mL (ref 0.35–5.50)

## 2023-07-12 LAB — VITAMIN B12: Vitamin B-12: 304 pg/mL (ref 211–911)

## 2023-07-12 NOTE — Progress Notes (Signed)
 Established Patient Office Visit     CC/Reason for Visit: Annual preventive exam  HPI: Jamie Avila is a 63 y.o. female who is coming in today for the above mentioned reasons. Past Medical History is significant for: Hyperlipidemia, breast hyperplasia on an augmented screening protocol, ovarian cystadenomas.  Follows routinely with GYN.  Last mammogram was normal in January.  She has been having some pain and numbness of her left 3rd and 4th toes with cramping at times.  Has routine eye and dental care.   Past Medical/Surgical History: Past Medical History:  Diagnosis Date   GERD (gastroesophageal reflux disease)    Left breast mass    Ovarian cyst 1982   Peroneal tendinitis, left 05/26/2011   Proximal humerus fracture 2016   Right knee pain 09/04/2010   Sacroiliac joint pain 09/22/2011   Pain localizes directly over SIJ SIJ movement seemed OK    Shoulder joint dislocation 10/16/2010   She had a posterior glenohumeral joint dislocation on the right and a partial a.c. joint dislocation on the left from a fall while hiking    Squamous cell carcinoma of leg     Past Surgical History:  Procedure Laterality Date   BREAST LUMPECTOMY WITH RADIOACTIVE SEED LOCALIZATION Left 08/17/2018   Procedure: LEFT BREAST LUMPECTOMY WITH RADIOACTIVE SEED LOCALIZATION;  Surgeon: Belinda Cough, MD;  Location: [facility];  Service: General;  Laterality: Left;   COLONOSCOPY  2012   repeat 2022   HUMERUS FRACTURE SURGERY  2016   HUMERUS FRACTURE SURGERY Right    OVARIAN CYST REMOVAL  12/1979   pt can't remember the side, maybe right side   ROBOTIC ASSISTED SALPINGO OOPHERECTOMY Bilateral 12/29/2022   Procedure: XI ROBOTIC ASSISTED SALPINGO OOPHORECTOMY;  Surgeon: Eldonna Mays, MD;  Location: [facility];  Service: Gynecology;  Laterality: Bilateral;    Social History:  reports that she has never smoked. She has never used smokeless tobacco. She reports current alcohol  use. She reports that she does not use drugs.  Allergies: Allergies  Allergen Reactions   Penicillins Rash    Family History:  Family History  Problem Relation Age of Onset   High Cholesterol Mother    Osteoporosis Mother    COPD Mother    Arthritis Mother        rheumatoid in hands; mild   Hearing loss Mother    Colon polyps Father    High Cholesterol Father    Arthritis Father        osteo   Osteoporosis Father    CAD Father    Heart disease Maternal Grandmother        CHF; worsened after hip fracture   Early death Maternal Grandfather        uncertain cause   Heart attack Paternal Grandmother 40   Heart disease Paternal Grandmother    Early death Paternal Grandfather 38       ?valve issue   Heart attack Paternal Grandfather    Heart disease Paternal Grandfather    Breast cancer Neg Hx    Colon cancer Neg Hx    Esophageal cancer Neg Hx    Rectal cancer Neg Hx    Stomach cancer Neg Hx    Cancer - Colon Neg Hx    Ovarian cancer Neg Hx    Endometrial cancer Neg Hx    Pancreatic cancer Neg Hx    Prostate cancer Neg Hx     No current outpatient medications on file.  Review of Systems:  Negative unless indicated in HPI.   Physical Exam: Vitals:   07/12/23 0907  BP: 120/76  Pulse: 72  Resp: 18  Temp: (!) 97.5 F (36.4 C)  TempSrc: Oral  SpO2: 95%  Weight: 133 lb 11.2 oz (60.6 kg)  Height: 5' 4.57 (1.64 m)    Body mass index is 22.55 kg/m.   Physical Exam Vitals reviewed.  Constitutional:      General: She is not in acute distress.    Appearance: Normal appearance. She is not ill-appearing, toxic-appearing or diaphoretic.  HENT:     Head: Normocephalic.     Right Ear: Tympanic membrane, ear canal and external ear normal. There is no impacted cerumen.     Left Ear: Tympanic membrane, ear canal and external ear normal. There is no impacted cerumen.     Nose: Nose normal.     Mouth/Throat:     Mouth: Mucous membranes are moist.     Pharynx:  Oropharynx is clear. No oropharyngeal exudate or posterior oropharyngeal erythema.   Eyes:     General: No scleral icterus.       Right eye: No discharge.        Left eye: No discharge.     Conjunctiva/sclera: Conjunctivae normal.     Pupils: Pupils are equal, round, and reactive to light.   Neck:     Vascular: No carotid bruit.   Cardiovascular:     Rate and Rhythm: Normal rate and regular rhythm.     Pulses: Normal pulses.     Heart sounds: Normal heart sounds.  Pulmonary:     Effort: Pulmonary effort is normal. No respiratory distress.     Breath sounds: Normal breath sounds.  Abdominal:     General: Abdomen is flat. Bowel sounds are normal.     Palpations: Abdomen is soft.   Musculoskeletal:        General: Normal range of motion.     Cervical back: Normal range of motion.   Skin:    General: Skin is warm and dry.   Neurological:     General: No focal deficit present.     Mental Status: She is alert and oriented to person, place, and time. Mental status is at baseline.   Psychiatric:        Mood and Affect: Mood normal.        Behavior: Behavior normal.        Thought Content: Thought content normal.        Judgment: Judgment normal.     Flowsheet Row Office Visit from 07/08/2022 in [facility] [place]  PHQ-9 Total Score 0     Impression and Plan:  Encounter for preventive health examination -     CBC with Differential/Platelet; Future -     Comprehensive metabolic panel with GFR; Future  Vitamin D  deficiency -     VITAMIN D  25 Hydroxy (Vit-D Deficiency, Fractures); Future  Mixed hyperlipidemia -     Lipid panel; Future -     TSH; Future -     Vitamin B12; Future  Pain in toe of left foot   -Recommend routine eye and dental care. -Healthy lifestyle discussed in detail. -Labs to be updated today. -Prostate cancer screening: Not applicable Health Maintenance  Topic Date Due   COVID-19 Vaccine (8 - 2024-25 season)  09/20/2022   Flu Shot  08/20/2023   Mammogram  01/29/2024   Pap with HPV screening  12/18/2026  DTaP/Tdap/Td vaccine (2 - Td or Tdap) 11/09/2027   Colon Cancer Screening  04/22/2028   Hepatitis C Screening  Completed   HIV Screening  Completed   Zoster (Shingles) Vaccine  Completed   HPV Vaccine  Aged Out   Meningitis B Vaccine  Aged Out     -Check electrolytes today, if normal can consider referring to podiatry for evaluation of possible Morton's neuroma of left foot.    Tully Theophilus Andrews, MD [facility]

## 2023-07-13 MED ORDER — VITAMIN D (ERGOCALCIFEROL) 1.25 MG (50000 UNIT) PO CAPS: 50000.0000 [IU] | ORAL_CAPSULE | ORAL | 0 refills | Status: AC

## 2023-09-07 ENCOUNTER — Other Ambulatory Visit: Payer: Self-pay | Admitting: Obstetrics and Gynecology

## 2023-09-07 DIAGNOSIS — Z1231 Encounter for screening mammogram for malignant neoplasm of breast: Secondary | ICD-10-CM

## 2023-09-15 ENCOUNTER — Ambulatory Visit
Admission: RE | Admit: 2023-09-15 | Discharge: 2023-09-15 | Disposition: A | Discharge status: Home / Self Care | Source: Ambulatory Visit | Attending: Obstetrics and Gynecology | Admitting: Obstetrics and Gynecology

## 2023-09-15 DIAGNOSIS — Z1231 Encounter for screening mammogram for malignant neoplasm of breast: Secondary | ICD-10-CM | POA: Diagnosis not present

## 2023-10-03 IMAGING — CT CT CARDIAC CORONARY ARTERY CALCIUM SCORE
3 series · 14 of 20 positions shown, 16 images · non-contrast
Comparison: None
COMPARISON: None

Addendum:
:
OVER-READ INTERPRETATION  CT CHEST

The following report is an over-read performed by radiologist Dr.
over-read does not include interpretation of cardiac or coronary
anatomy or pathology. The coronary calcium score interpretation by
the cardiologist is attached. Imaging of the chest is focused on
cardiac structures and excludes much of the chest on CT.
CLINICAL DATA: Cardiovascular disease risk stratification
CAD screening, low CAD risk
EXAM:
CT Coronary Calcium Score
TECHNIQUE: A gated, non-contrast computed tomography scan of the heart was
performed using 3mm slice thickness. Axial images were analyzed on a
dedicated workstation. Calcium scoring of the coronary arteries was
performed using the Agatston method.

[Series 2: cascseq 2.0 sa36 70% (id) · axial · 0.39mm/px · z∈[-233,-153]mm · 4 of 68 slices shown]
[im 14/68  vessel]
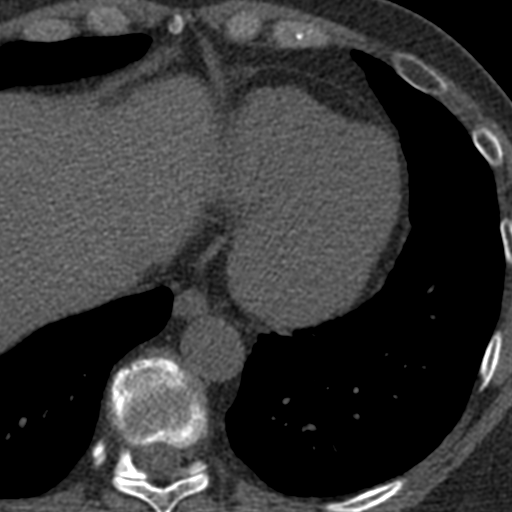
[im 27/68  vessel]
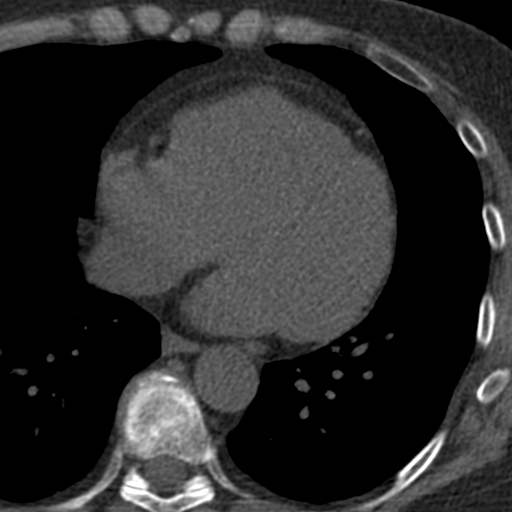
[im 41/68  vessel]
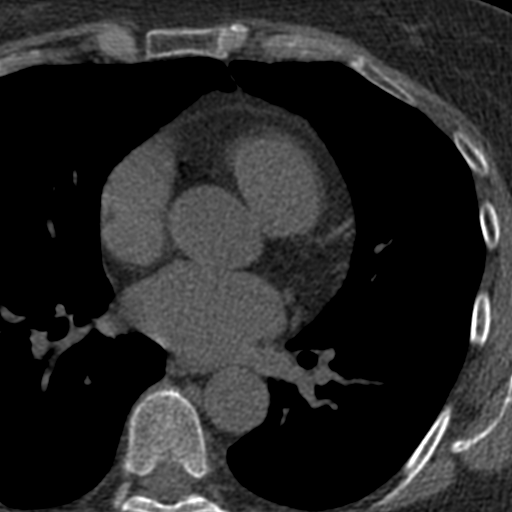
[im 54/68  vessel]
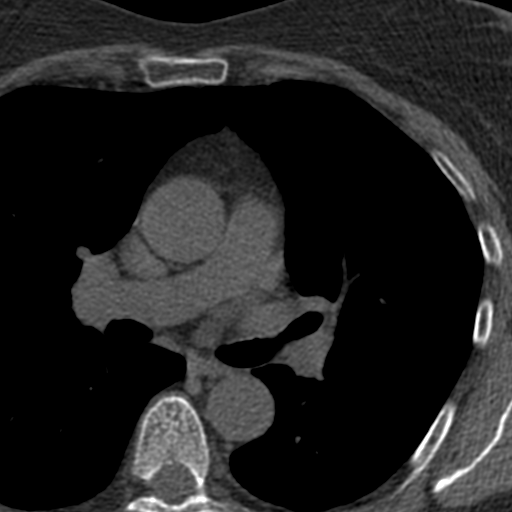

[Series 3: cascseq 2.0 bf37 st · axial · 0.69mm/px · z∈[-237,-149]mm · 5 of 68 slices shown, 7 images]
[im 12/68  vessel]
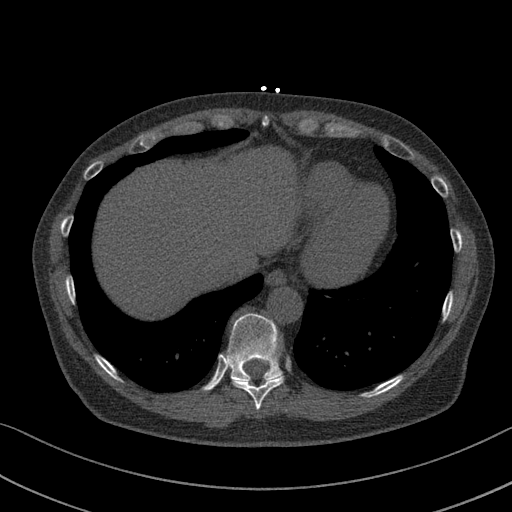
[im 12/68  lung]
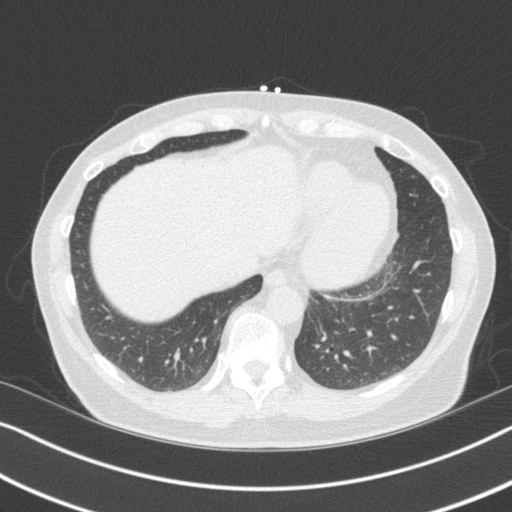
[im 23/68  vessel]
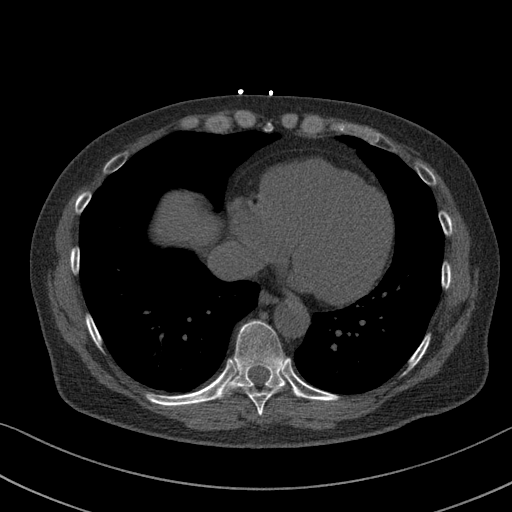
[im 34/68  vessel]
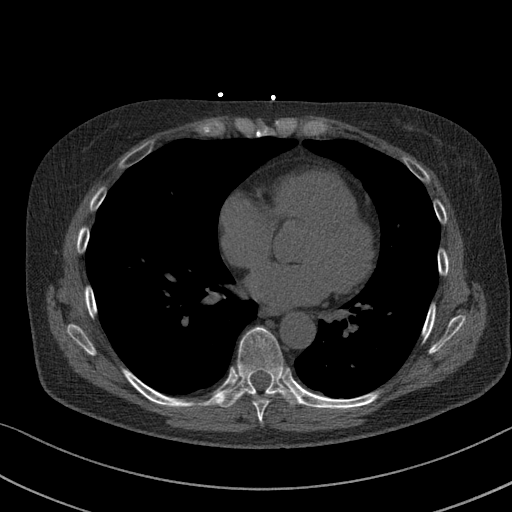
[im 45/68  vessel]
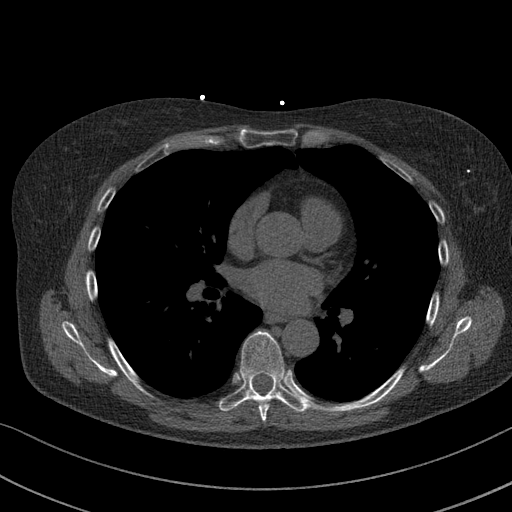
[im 56/68  vessel]
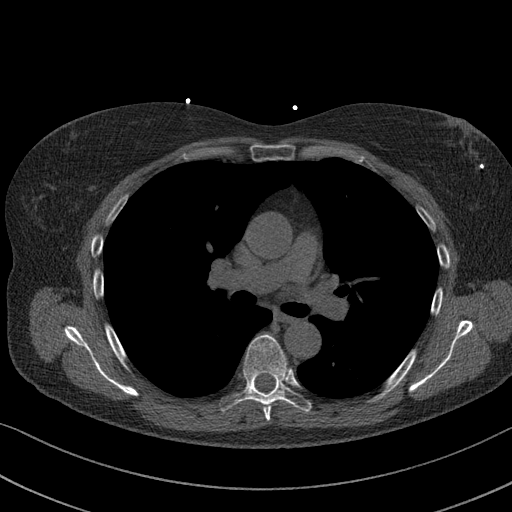
[im 56/68  lung]
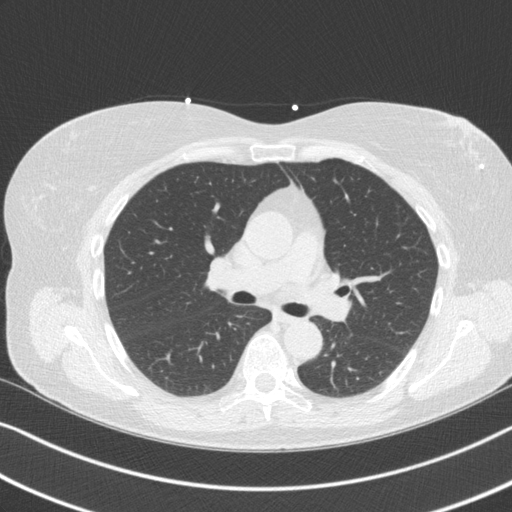

[Series 4: cascseq 2.0 br59 lung · axial · 0.69mm/px · z∈[-237,-149]mm · 5 of 68 slices shown]
[im 12/68  lung]
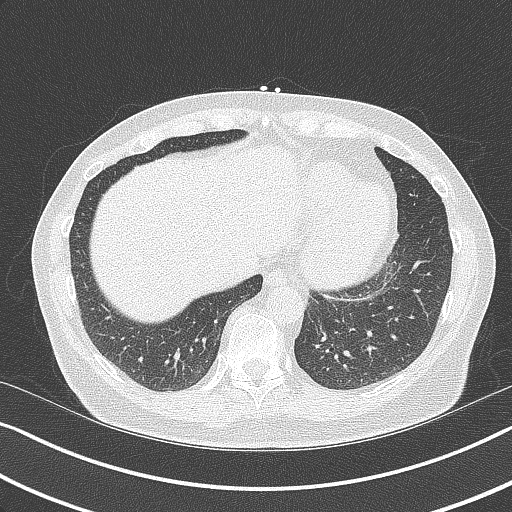
[im 23/68  lung]
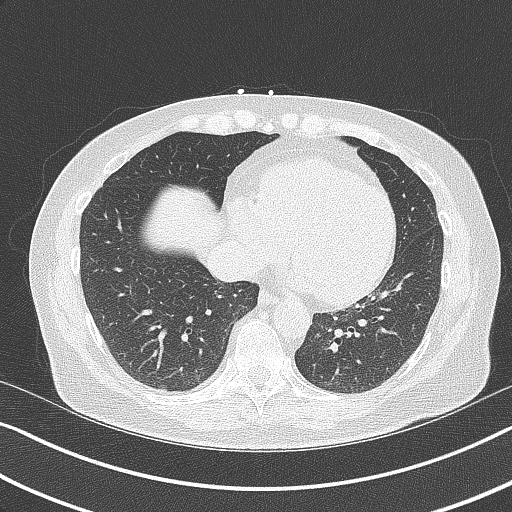
[im 34/68  lung]
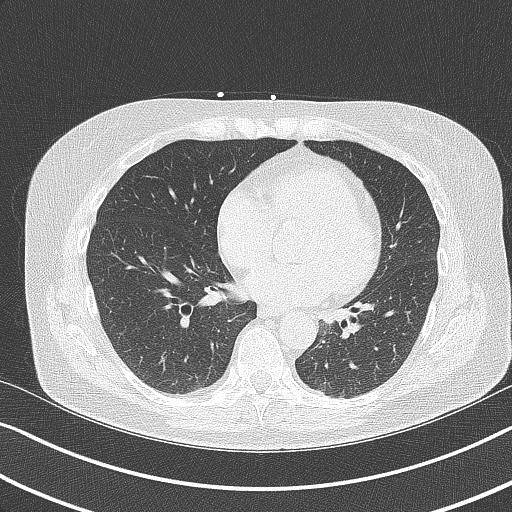
[im 45/68  lung]
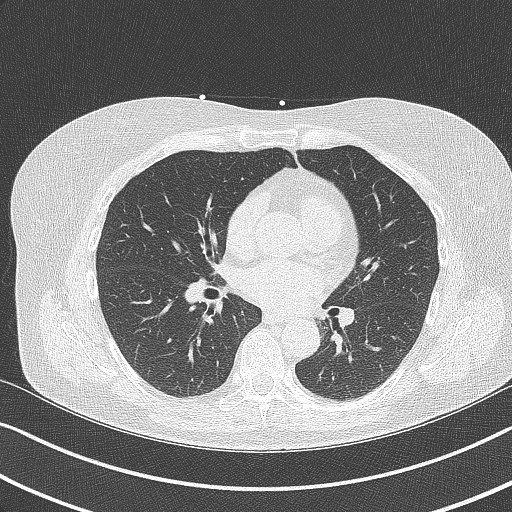
[im 56/68  lung]
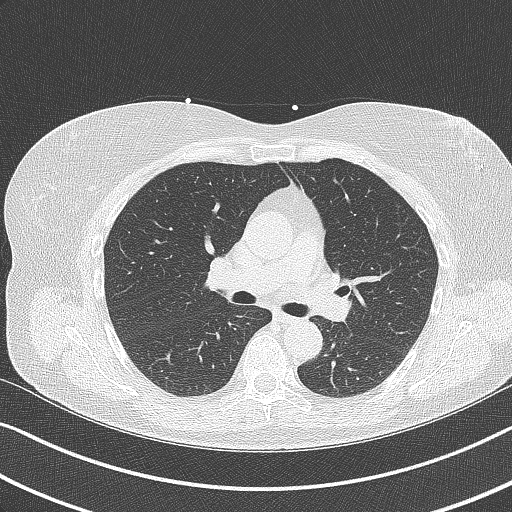

[14 of 20 positions shown; findings below may reference images not displayed]

FINDINGS: Cardiovascular: Please see dedicated report for cardiovascular
details.

Mediastinum/Nodes: No signs of adenopathy or of acute process in the
mediastinum to the extent evaluated.

Lungs/Pleura: Visualized lungs are clear and airways are patent.

Upper Abdomen: Incidental imaging of upper abdominal contents
without acute process.

Musculoskeletal: No acute or destructive bone findings.
IMPRESSION: No acute or significant extracardiac findings.
FINDINGS: Coronary Calcium Score:

Left main: 0

Left anterior descending artery: 0

Left circumflex artery: 0

Right coronary artery: 0

Total: 0

Pericardium: Normal.

Ascending Aorta: Normal caliber. Ascending aorta measures
approximately 32mm at the mid ascending aorta measured in an axial
plane.

Non-cardiac: See separate report from [REDACTED].
IMPRESSION: Coronary calcium score of 0.



If CAC=0, it is reasonable to withhold statin therapy and reassess
in 5 to 10 years, as long as higher risk conditions are absent
(diabetes mellitus, family history of premature CHD in first degree
relatives (males <55 years; females <65 years), cigarette smoking,
or LDL >=190 mg/dL).

If CAC is 1 to 99, it is reasonable to initiate statin therapy for
patients >=55 years of age.

If CAC is >=100 or >=75th percentile, it is reasonable to initiate
statin therapy at any age.

Cardiology referral should be considered for patients with CAC
scores >=400 or >=75th percentile.

*0242 AHA/ACC/AACVPR/AAPA/ABC/BREED/COMPRESSOR/SHIN/[place]/GIPSER/SAEID/JIM
Guideline on the Management of Blood Cholesterol: A Report of the
American College of Cardiology/American Heart Association Task Force
on Clinical Practice Guidelines. J Am Coll Cardiol.
9517;73(24):5128-5224.

*** End of Addendum ***
:
OVER-READ INTERPRETATION  CT CHEST

The following report is an over-read performed by radiologist Dr.
over-read does not include interpretation of cardiac or coronary
anatomy or pathology. The coronary calcium score interpretation by
the cardiologist is attached. Imaging of the chest is focused on
cardiac structures and excludes much of the chest on CT.
FINDINGS: Cardiovascular: Please see dedicated report for cardiovascular
details.

Mediastinum/Nodes: No signs of adenopathy or of acute process in the
mediastinum to the extent evaluated.

Lungs/Pleura: Visualized lungs are clear and airways are patent.

Upper Abdomen: Incidental imaging of upper abdominal contents
without acute process.

Musculoskeletal: No acute or destructive bone findings.
IMPRESSION: No acute or significant extracardiac findings.
